# Patient Record
Sex: Male | Born: 1950 | Race: White | Hispanic: No | Marital: Married | Smoking: Current every day smoker
Health system: Southern US, Community
[De-identification: ages and names within clinical notes are randomized; demographics above are authoritative.]

## PROBLEM LIST (undated history)

## (undated) DIAGNOSIS — C4490 Unspecified malignant neoplasm of skin, unspecified: Secondary | ICD-10-CM

## (undated) DIAGNOSIS — H9190 Unspecified hearing loss, unspecified ear: Secondary | ICD-10-CM

## (undated) DIAGNOSIS — I10 Essential (primary) hypertension: Secondary | ICD-10-CM

## (undated) DIAGNOSIS — I499 Cardiac arrhythmia, unspecified: Secondary | ICD-10-CM

## (undated) DIAGNOSIS — M1990 Unspecified osteoarthritis, unspecified site: Secondary | ICD-10-CM

## (undated) DIAGNOSIS — K219 Gastro-esophageal reflux disease without esophagitis: Secondary | ICD-10-CM

## (undated) DIAGNOSIS — H8102 Meniere's disease, left ear: Secondary | ICD-10-CM

## (undated) DIAGNOSIS — I739 Peripheral vascular disease, unspecified: Secondary | ICD-10-CM

## (undated) DIAGNOSIS — I509 Heart failure, unspecified: Secondary | ICD-10-CM

## (undated) HISTORY — DX: Unspecified osteoarthritis, unspecified site: M19.90

## (undated) HISTORY — DX: Peripheral vascular disease, unspecified: I73.9

## (undated) HISTORY — PX: BACK SURGERY: SHX140

## (undated) HISTORY — PX: FOOT SURGERY: SHX648

---

## 2013-10-11 ENCOUNTER — Encounter: Payer: Self-pay | Admitting: Podiatry

## 2013-10-11 ENCOUNTER — Ambulatory Visit (INDEPENDENT_AMBULATORY_CARE_PROVIDER_SITE_OTHER): Payer: BC Managed Care – PPO | Admitting: Podiatry

## 2013-10-11 ENCOUNTER — Ambulatory Visit (INDEPENDENT_AMBULATORY_CARE_PROVIDER_SITE_OTHER): Payer: BC Managed Care – PPO

## 2013-10-11 DIAGNOSIS — M205X1 Other deformities of toe(s) (acquired), right foot: Secondary | ICD-10-CM

## 2013-10-11 DIAGNOSIS — M7989 Other specified soft tissue disorders: Principal | ICD-10-CM

## 2013-10-11 DIAGNOSIS — M79631 Pain in right forearm: Secondary | ICD-10-CM

## 2013-10-11 DIAGNOSIS — R52 Pain, unspecified: Secondary | ICD-10-CM

## 2013-10-11 NOTE — Patient Instructions (Signed)
Pre-Operative Instructions  Congratulations, you have decided to take an important step to improving your quality of life.  You can be assured that the doctors of [facility] will be with you every step of the way.  1. Plan to be at the surgery center/hospital at least 1 (one) hour prior to your scheduled time unless otherwise directed by the surgical center/hospital staff.  You must have a responsible adult accompany you, remain during the surgery and drive you home.  Make sure you have directions to the surgical center/hospital and know how to get there on time. 2. For hospital based surgery you will need to obtain a history and physical form from your family physician within 1 month prior to the date of surgery- we will give you a form for you primary physician.  3. We make every effort to accommodate the date you request for surgery.  There are however, times where surgery dates or times have to be moved.  We will contact you as soon as possible if a change in schedule is required.   4. No Aspirin/Ibuprofen for one week before surgery.  If you are on aspirin, any non-steroidal anti-inflammatory medications (Mobic, Aleve, Ibuprofen) you should stop taking it 7 days prior to your surgery.  You make take Tylenol  For pain prior to surgery.  5. Medications- If you are taking daily heart and blood pressure medications, seizure, reflux, allergy, asthma, anxiety, pain or diabetes medications, make sure the surgery center/hospital is aware before the day of surgery so they may notify you which medications to take or avoid the day of surgery. 6. No food or drink after midnight the night before surgery unless directed otherwise by surgical center/hospital staff. 7. No alcoholic beverages 24 hours prior to surgery.  No smoking 24 hours prior to or 24 hours after surgery. 8. Wear loose pants or shorts- loose enough to fit over bandages, boots, and casts. 9. No slip on shoes, sneakers are best. 10. Bring  your boot with you to the surgery center/hospital.  Also bring crutches or a walker if your physician has prescribed it for you.  If you do not have this equipment, it will be provided for you after surgery. 11. If you have not been contracted by the surgery center/hospital by the day before your surgery, call to confirm the date and time of your surgery. 12. Leave-time from work may vary depending on the type of surgery you have.  Appropriate arrangements should be made prior to surgery with your employer. 13. Prescriptions will be provided immediately following surgery by your doctor.  Have these filled as soon as possible after surgery and take the medication as directed. 14. Remove nail polish on the operative foot. 15. Wash the night before surgery.  The night before surgery wash the foot and leg well with the antibacterial soap provided and water paying special attention to beneath the toenails and in between the toes.  Rinse thoroughly with water and dry well with a towel.  Perform this wash unless told not to do so by your physician.  Enclosed: 1 Ice pack (please put in freezer the night before surgery)   1 Hibiclens skin cleaner   Pre-op Instructions  If you have any questions regarding the instructions, do not hesitate to call our office.  [place]: [address] [place], [place] [address] [phone]  [place]: [address] , [place] [address] [phone]  [place]: [address]  [place],  [address] [phone]  Dr. Richard   Tuchman DPM, Dr. Norman Regal DPM Dr. Richard Sikora DPM, Dr. M. Todd Hyatt DPM, Dr. Kathryn Egerton DPM 

## 2013-10-11 NOTE — Progress Notes (Signed)
   Subjective:    Patient ID: Daniel Pierce, male    DOB: 01/22/1950, 63 y.o.   MRN: [id number]  HPI  PT STATED RT FOOT ARCH, SWOLLEN  AND BALL OF THE SIDE OF THE FOOT IS SORE AND HAVE TIGHTNESS FEELING FOR 3 YEARS. THE FOOT IS GETTING WORSE NOT GETTING ANY BETTER. THE FOOT GET AGGRAVATED BY WALKING/PRESSURE. TRIED TO WEAR GOOD SHOES.   Review of Systems  Musculoskeletal: Positive for gait problem.  All other systems reviewed and are negative.      Objective:   Physical Exam: I have reviewed his past medical history medications allergies surgeries social history and review of systems. Pulses are strongly palpable bilateral. Neurologic sensorium is intact per since once the monofilament. Deep tendon reflexes are intact bilateral muscle strength is 5 over 5 dorsiflexors plantar flexors inverters everters all intrinsic musculature is intact. Orthopedic evaluation demonstrates limitation with regarding on range of motion of the first metatarsophalangeal joint of the right foot. He states he with range of motion of the first metatarsophalangeal joint he has pain that radiates proximally up his leg. Radiographic evaluation does demonstrate joint space narrowing subchondral sclerosis dorsal spurring and intra-articular ossicles. Cutaneous evaluation demonstrates supple well hydrated cutis no erythema tali this drainage or odor.        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  Plan: Discussed etiology pathology conservative versus surgical therapies. Due to the inability of him to be able to perform his daily activities surgery is indicated. We discussed as well as consented him to a Keller arthroplasty with single silicone implant right foot. I answered all the questions regarding this procedure to the best of my ability in layman's terms. He understands that is amenable to it and signed all 3 pages of the consent  form. We did discuss a possible postop complications which may include but are not limited to postop pain bleeding swelling allergic reaction to the implant the possibility of needing to fuse the joint. Also digit loss of limb loss of life. He was dispensed a Cam Walker for postop. And I will followup with him in one month for surgery.

## 2013-10-20 ENCOUNTER — Other Ambulatory Visit: Payer: Self-pay | Admitting: Podiatry

## 2013-10-20 MED ORDER — OXYCODONE-ACETAMINOPHEN 10-325 MG PO TABS: ORAL_TABLET | ORAL | Status: DC

## 2013-10-20 MED ORDER — CEPHALEXIN 500 MG PO CAPS: 500.0000 mg | ORAL_CAPSULE | Freq: Three times a day (TID) | ORAL | Status: DC

## 2013-10-20 MED ORDER — PROMETHAZINE HCL 25 MG PO TABS: 25.0000 mg | ORAL_TABLET | Freq: Three times a day (TID) | ORAL | Status: DC | PRN

## 2013-10-27 ENCOUNTER — Encounter: Payer: BC Managed Care – PPO | Admitting: Podiatry

## 2013-11-30 ENCOUNTER — Telehealth: Payer: Self-pay | Admitting: *Deleted

## 2013-11-30 NOTE — Telephone Encounter (Signed)
I called and informed the patient's wife that we have to have a letter sent to [place] by the doctor stating that he has been cleared for surgery.  "Okay, I'll call and let them know today.  Once you get that he can schedule surgery?"  I told her yes, we can schedule after we receive the note.

## 2013-11-30 NOTE — Telephone Encounter (Signed)
-----   Message from Roney Jaffe, RN sent at 11/30/2013  7:59 AM EST ----- This patient went to the cardiologist and he was cleared to have surgery, Dr Milinda Pointer patient. Can you please call to schedule a time [phone]

## 2013-12-08 ENCOUNTER — Telehealth: Payer: Self-pay | Admitting: *Deleted

## 2013-12-08 NOTE — Telephone Encounter (Signed)
Pt's wife asked if the medical clearance or note from the doctor okaying pt's surgery had arrived, and she would like to schedule surgery for the pt.

## 2013-12-09 NOTE — Telephone Encounter (Signed)
I returned her call.  "He wants to schedule surgery.  He wanted today because he's having a lot of pain with that foot.  I asked her if he signed consent forms already.  "He was scheduled before on 10/21/2013 but was not able to have it done.  Dr. Milinda Pointer said he needed to get medical clearance.  I guess he's going to have to wait now until January because he doesn't want to do it too close to Christmas.  What does he have available?"  I told her he can do it on 01/13/2014 or 01/20/2014.  She called her husband and stated, "He wants to do it on January 8th."  I told her I would get it scheduled.  I asked Kathryne Hitch for her surgical consent form and previous scheduling sheet.  She said she would locate it, it may have been sent off to be scanned.  Janett Billow gave me first and second pages of the consent form, the foot diagram was misplaced.

## 2014-01-10 ENCOUNTER — Telehealth: Payer: Self-pay | Admitting: *Deleted

## 2014-01-10 NOTE — Telephone Encounter (Signed)
Pt's wife states her husband is scheduled with Dr. Milinda Pointer for surgery on 01/13/2014, and she would like to know what medications he can take prior to the surgery.

## 2014-01-11 ENCOUNTER — Telehealth: Payer: Self-pay

## 2014-01-11 NOTE — Telephone Encounter (Signed)
Called and left a message for patient to call back with questions or concerns

## 2014-01-11 NOTE — Telephone Encounter (Signed)
Pt's wife reported to the office, asked if pt should continue the low-dose aspirin prescribed by his cardiologist, scheduled for surgery on Friday.   I told the pt's to have the pt continue the low-dose aspirin since prescribed by his cardiologist, but to be aware the pt may have a little more bleeding than someone not on the low-dose aspirin.

## 2014-01-13 DIAGNOSIS — M2011 Hallux valgus (acquired), right foot: Secondary | ICD-10-CM

## 2014-01-13 DIAGNOSIS — M205X1 Other deformities of toe(s) (acquired), right foot: Secondary | ICD-10-CM

## 2014-01-18 ENCOUNTER — Encounter: Payer: Self-pay | Admitting: Podiatry

## 2014-01-18 NOTE — Progress Notes (Unsigned)
DOS 01/13/2014 right keller bunion implant.

## 2014-01-19 ENCOUNTER — Ambulatory Visit (INDEPENDENT_AMBULATORY_CARE_PROVIDER_SITE_OTHER): Payer: BLUE CROSS/BLUE SHIELD | Admitting: Podiatry

## 2014-01-19 ENCOUNTER — Ambulatory Visit (INDEPENDENT_AMBULATORY_CARE_PROVIDER_SITE_OTHER): Payer: BLUE CROSS/BLUE SHIELD

## 2014-01-19 VITALS — BP 113/65 | HR 61 | Temp 98.1°F | Resp 16

## 2014-01-19 DIAGNOSIS — M21611 Bunion of right foot: Secondary | ICD-10-CM

## 2014-01-19 DIAGNOSIS — Z9889 Other specified postprocedural states: Secondary | ICD-10-CM

## 2014-01-19 DIAGNOSIS — M2011 Hallux valgus (acquired), right foot: Secondary | ICD-10-CM

## 2014-01-20 NOTE — Progress Notes (Signed)
He presents today 1 week status post Keller arthroplasty with a single silicone implant right foot. He states it is somewhat tender but doing all right. He denies fever chills nausea vomiting muscle aches and pains. States that he is tired of sitting.  Objective: He presents today dry sterile compressive and intact and wearing his Cam Walker. Vital signs are stable he is alert and oriented 3. Dry sterile dressing removed demonstrates mild edema no erythema cellulitis drainage or odor. Sutures are intact margins are well coapted with the exception of the distalmost aspect which demonstrates some very mild dehiscence. He has a great range of motion on active and passive manipulation. This is nontender. Radiographic evaluation demonstrates well-placed Keller arthroplasty single silicone implant right foot.  Assessment: Well-healing surgical foot right. Date of surgery 01/13/2014. Keller arthroplasty single silicone implant right foot.  Plan: [place] today with a dry sterile compressive dressing. Encouraged him to continue range of motion exercises and to continue the use of the Cam Walker. He is to keep this clean and dry and I will follow-up with him in 1 week at which time we will consider either a tennis shoe or a Darco shoe.

## 2014-01-26 ENCOUNTER — Ambulatory Visit (INDEPENDENT_AMBULATORY_CARE_PROVIDER_SITE_OTHER): Payer: BLUE CROSS/BLUE SHIELD

## 2014-01-26 ENCOUNTER — Ambulatory Visit (INDEPENDENT_AMBULATORY_CARE_PROVIDER_SITE_OTHER): Payer: BLUE CROSS/BLUE SHIELD | Admitting: Podiatry

## 2014-01-26 VITALS — BP 112/60 | HR 60 | Resp 16

## 2014-01-26 DIAGNOSIS — M2011 Hallux valgus (acquired), right foot: Secondary | ICD-10-CM

## 2014-01-26 DIAGNOSIS — M21611 Bunion of right foot: Secondary | ICD-10-CM

## 2014-01-26 DIAGNOSIS — Z9889 Other specified postprocedural states: Secondary | ICD-10-CM

## 2014-01-26 MED ORDER — CLINDAMYCIN HCL 150 MG PO CAPS: 150.0000 mg | ORAL_CAPSULE | Freq: Three times a day (TID) | ORAL | Status: DC

## 2014-01-26 NOTE — Progress Notes (Signed)
Daniel Pierce presents today he is now 3 weeks status post Daniel Pierce arthroplasty with a single silicone implant left foot. He denies fever chills nausea vomiting muscle aches and pains.  Objective: Vital signs are stable he is alert and oriented 3. Dry sterile dressing was removed today demonstrates well-healing surgical foot terrific range of motion of the first metatarsophalangeal joint. Radiographs confirmed same.  Assessment: Well-healing surgical foot status post Keller arthroplasty single silicone implant left foot.  Plan: I would allow him to start getting this wet and washing it. I encouraged him to continue to use the Darco shoe and a compression anklet. I will follow-up with him in 2 weeks at which time we'll try to get back into a regular pair of tennis shoes.

## 2014-02-09 ENCOUNTER — Ambulatory Visit (INDEPENDENT_AMBULATORY_CARE_PROVIDER_SITE_OTHER): Payer: BLUE CROSS/BLUE SHIELD

## 2014-02-09 ENCOUNTER — Other Ambulatory Visit: Payer: Self-pay | Admitting: Podiatry

## 2014-02-09 ENCOUNTER — Ambulatory Visit (INDEPENDENT_AMBULATORY_CARE_PROVIDER_SITE_OTHER): Payer: BLUE CROSS/BLUE SHIELD | Admitting: Podiatry

## 2014-02-09 DIAGNOSIS — Z9889 Other specified postprocedural states: Secondary | ICD-10-CM

## 2014-02-09 DIAGNOSIS — M2012 Hallux valgus (acquired), left foot: Secondary | ICD-10-CM

## 2014-02-09 DIAGNOSIS — M21612 Bunion of left foot: Secondary | ICD-10-CM

## 2014-02-09 MED ORDER — MUPIROCIN 2 % EX OINT: 1.0000 "application " | TOPICAL_OINTMENT | Freq: Two times a day (BID) | CUTANEOUS | Status: DC

## 2014-02-09 NOTE — Progress Notes (Signed)
He presents today approximately one-month status post Keller arthroplasty single silicone implant left foot. He denies fever chills nausea vomiting muscle aches and pains. Continues to keep the toe covered with a Band-Aid.  Objective: Vital signs are stable he is alert and oriented 3 as a full range of motion of the first metatarsophalangeal joint of the right foot. Superficial dehiscence of the distalmost aspect of the incision site with no erythema cellulitis drainage or odor fibrin deposition is noted. Debrided fibrin deposition today bleeding no signs of infection and does not probe deep.  Assessment: Well-healing surgical foot status post Keller arthroplasty single silicone implant with mild dehiscence distal wound.  Plan: Wrote prescription for Bactroban ointment and encouraged him to scrub the foot on a daily basis with a surgical scrub brush provided to him. He will then apply a small amount of the Bactroban and follow-up with me in 2 weeks.

## 2014-02-23 ENCOUNTER — Encounter: Payer: BLUE CROSS/BLUE SHIELD | Admitting: Podiatry

## 2015-02-26 DIAGNOSIS — Z9289 Personal history of other medical treatment: Secondary | ICD-10-CM | POA: Insufficient documentation

## 2015-09-04 ENCOUNTER — Ambulatory Visit (INDEPENDENT_AMBULATORY_CARE_PROVIDER_SITE_OTHER): Payer: BLUE CROSS/BLUE SHIELD | Admitting: Podiatry

## 2015-09-04 ENCOUNTER — Encounter: Payer: Self-pay | Admitting: Podiatry

## 2015-09-04 ENCOUNTER — Ambulatory Visit (INDEPENDENT_AMBULATORY_CARE_PROVIDER_SITE_OTHER): Payer: BLUE CROSS/BLUE SHIELD

## 2015-09-04 VITALS — BP 130/64 | HR 35 | Resp 16

## 2015-09-04 DIAGNOSIS — M778 Other enthesopathies, not elsewhere classified: Secondary | ICD-10-CM

## 2015-09-04 DIAGNOSIS — M779 Enthesopathy, unspecified: Secondary | ICD-10-CM

## 2015-09-04 DIAGNOSIS — M21611 Bunion of right foot: Secondary | ICD-10-CM | POA: Diagnosis not present

## 2015-09-04 DIAGNOSIS — R52 Pain, unspecified: Secondary | ICD-10-CM

## 2015-09-04 DIAGNOSIS — M7751 Other enthesopathy of right foot: Secondary | ICD-10-CM | POA: Diagnosis not present

## 2015-09-04 DIAGNOSIS — M722 Plantar fascial fibromatosis: Secondary | ICD-10-CM

## 2015-09-04 MED ORDER — METHYLPREDNISOLONE 4 MG PO TBPK: ORAL_TABLET | ORAL | 0 refills | Status: DC

## 2015-09-04 MED ORDER — MELOXICAM 15 MG PO TABS: 15.0000 mg | ORAL_TABLET | Freq: Every day | ORAL | 3 refills | Status: DC

## 2015-09-04 NOTE — Patient Instructions (Signed)

## 2015-09-05 NOTE — Progress Notes (Signed)
He presents today with a chief complaint of pain and pulling sensation to the dorsal aspect of his right foot as well as plantar medial aspect of the first metatarsophalangeal joint. He had a joint replacement done couple of years ago and states that he was doing fine until this past July. He relates some heel pain and more significant pain about the second metatarsal and second toe area. He denies trauma.  Objective: Vital signs are stable he is alert and oriented 3. I have reviewed his past medical history medications and allergies pulses are strongly palpable. He has great range of motion of all of the joints. Second metatarsophalangeal joint is tender on end range of motion. He has pain on palpation medial calcaneal tubercle. Radiographs taken today demonstrate Keller arthroplasty with single silicone implant intact without spurring he does have an elongated second metatarsal with medial deviation of the toe consistent with early dislocation syndrome he also has soft tissue increase in density of the plantar fascia calcaneal insertion site. No open lesions or wounds.  Assessment: Plantar fasciitis capsulitis second metatarsophalangeal joint right foot.  Plan: I injected the area today with Kenalog and local anesthetic to the heel and second metatarsophalangeal joint. Supplied him with a plantar fascia brace placed him on a Medrol Dosepak as well as meloxicam.

## 2015-09-25 ENCOUNTER — Ambulatory Visit: Payer: BLUE CROSS/BLUE SHIELD | Admitting: Podiatry

## 2016-01-06 ENCOUNTER — Other Ambulatory Visit: Payer: Self-pay | Admitting: Podiatry

## 2017-06-16 ENCOUNTER — Ambulatory Visit (INDEPENDENT_AMBULATORY_CARE_PROVIDER_SITE_OTHER): Payer: Medicare Other

## 2017-06-16 ENCOUNTER — Encounter: Payer: Self-pay | Admitting: Podiatry

## 2017-06-16 ENCOUNTER — Ambulatory Visit (INDEPENDENT_AMBULATORY_CARE_PROVIDER_SITE_OTHER): Payer: Medicare Other | Admitting: Podiatry

## 2017-06-16 DIAGNOSIS — I10 Essential (primary) hypertension: Secondary | ICD-10-CM | POA: Insufficient documentation

## 2017-06-16 DIAGNOSIS — M545 Low back pain, unspecified: Secondary | ICD-10-CM | POA: Insufficient documentation

## 2017-06-16 DIAGNOSIS — M2041 Other hammer toe(s) (acquired), right foot: Secondary | ICD-10-CM

## 2017-06-16 DIAGNOSIS — E782 Mixed hyperlipidemia: Secondary | ICD-10-CM | POA: Insufficient documentation

## 2017-06-16 DIAGNOSIS — M216X1 Other acquired deformities of right foot: Secondary | ICD-10-CM

## 2017-06-16 DIAGNOSIS — I428 Other cardiomyopathies: Secondary | ICD-10-CM | POA: Insufficient documentation

## 2017-06-16 DIAGNOSIS — I350 Nonrheumatic aortic (valve) stenosis: Secondary | ICD-10-CM | POA: Insufficient documentation

## 2017-06-16 DIAGNOSIS — Z720 Tobacco use: Secondary | ICD-10-CM | POA: Insufficient documentation

## 2017-06-16 DIAGNOSIS — I493 Ventricular premature depolarization: Secondary | ICD-10-CM | POA: Insufficient documentation

## 2017-06-16 DIAGNOSIS — I2510 Atherosclerotic heart disease of native coronary artery without angina pectoris: Secondary | ICD-10-CM | POA: Insufficient documentation

## 2017-06-16 DIAGNOSIS — I4729 Other ventricular tachycardia: Secondary | ICD-10-CM | POA: Insufficient documentation

## 2017-06-16 DIAGNOSIS — I472 Ventricular tachycardia: Secondary | ICD-10-CM | POA: Insufficient documentation

## 2017-06-16 NOTE — Progress Notes (Signed)
  Subjective:  Patient ID: Daniel Pierce, male    DOB: [birth year],  MRN: [id number] HPI Chief Complaint  Patient presents with  . Foot Pain    right foot ball of foot, dorsal, 2nd and 3rd toe pain; pt stated, "had surgery done about 3-4 yrs ago on right foot bunion thats starting to bother me now; 2nd and 3rd toes are rising with pulling pain over the top of my foot, also hurt under toes on bottom of foot; have tightness on top around to bottom middle of foot; been swelling and not much feeling in foot lately; been 2-5yrs dealing with this"    67 y.o. male presents with the above complaint.   ROS: Denies fever chills nausea vomiting muscle aches and pains.  Past Medical History:  Diagnosis Date  . Arthritis    Past Surgical History:  Procedure Laterality Date  . BACK SURGERY      Current Outpatient Medications:  .  atorvastatin (LIPITOR) 20 MG tablet, , Disp: , Rfl:  .  cyclobenzaprine (FLEXERIL) 5 MG tablet, Take 5 mg by mouth as needed. , Disp: , Rfl:  .  lisinopril-hydrochlorothiazide (PRINZIDE,ZESTORETIC) 20-12.5 MG tablet, Take by mouth., Disp: , Rfl:  .  meloxicam (MOBIC) 15 MG tablet, Take 1 tablet (15 mg total) by mouth daily., Disp: 30 tablet, Rfl: 3 .  methylPREDNISolone (MEDROL) 4 MG TBPK tablet, Tapering 6 day dose pack, Disp: 21 tablet, Rfl: 0 .  metoprolol tartrate (LOPRESSOR) 25 MG tablet, Take by mouth., Disp: , Rfl:  .  traMADol (ULTRAM) 50 MG tablet, Take by mouth every 6 (six) hours as needed., Disp: , Rfl:   No Known Allergies Review of Systems Objective:  There were no vitals filed for this visit.  General: Well developed, nourished, in no acute distress, alert and oriented x3   Dermatological: Skin is warm, dry and supple bilateral. Nails x 10 are well maintained; remaining integument appears unremarkable at this time. There are no open sores, no preulcerative lesions, no rash or signs of infection present.  Vascular: Dorsalis Pedis artery and Posterior  Tibial artery pedal pulses are 2/4 bilateral with immedate capillary fill time. Pedal hair growth present. No varicosities and no lower extremity edema present bilateral.   Neruologic: Grossly intact via light touch bilateral. Vibratory intact via tuning fork bilateral. Protective threshold with Semmes Wienstein monofilament intact to all pedal sites bilateral. Patellar and Achilles deep tendon reflexes 2+ bilateral. No Babinski or clonus noted bilateral.   Musculoskeletal: No gross boney pedal deformities bilateral. No pain, crepitus, or limitation noted with foot and ankle range of motion bilateral. Muscular strength 5/5 in all groups tested bilateral.  Pain on palpation lesser metatarsal phalangeal joints with severe hammertoe deformities.  Toes are rigid and not able to straighten up completely.  There dorsiflexed over the head of the metatarsals.  Gait: Unassisted, Nonantalgic.    Radiographs:  Demonstrates Keller arthroplasty with a single silicone implant that is still in good position and good condition.  Demonstrate severe hammertoe deformities 2 through 5 of the right foot.  Assessment & Plan:   Assessment: Hammertoe deformity capsulitis metatarsalgia 2 through 5 of the right foot.    Plan: He was scanned for set of orthotics today.     Max T. [place], [place]

## 2017-06-19 ENCOUNTER — Telehealth: Payer: Self-pay | Admitting: Podiatry

## 2017-06-19 NOTE — Telephone Encounter (Signed)
Spoke to pts wife about the orthotics that were ordered and that they are not covered by medicare. The cost is 398.00 and to let me know if pt wants to proceed. She is to call on Monday.   I did not see a consent form signed in chart.

## 2017-06-22 NOTE — Telephone Encounter (Signed)
pts wife called back and states husband wants to go ahead and proceed with orthotics and is aware of the 398.00 charges

## 2017-06-23 NOTE — Telephone Encounter (Signed)
Can you watch to see if these get put on the correct doctor please?

## 2017-07-14 ENCOUNTER — Ambulatory Visit: Payer: Medicare Other | Admitting: Orthotics

## 2017-07-14 DIAGNOSIS — M21611 Bunion of right foot: Secondary | ICD-10-CM

## 2017-07-14 DIAGNOSIS — M216X1 Other acquired deformities of right foot: Secondary | ICD-10-CM

## 2017-07-14 NOTE — Progress Notes (Signed)
Patient came in today to pick up custom made foot orthotics.  The goals were accomplished and the patient reported no dissatisfaction with said orthotics.  Patient was advised of breakin period and how to report any issues. 

## 2017-07-21 NOTE — Telephone Encounter (Signed)
Orthotics were filed under correct doctor Metallurgist)

## 2017-08-25 ENCOUNTER — Ambulatory Visit (INDEPENDENT_AMBULATORY_CARE_PROVIDER_SITE_OTHER): Payer: Medicare Other | Admitting: Podiatry

## 2017-08-25 ENCOUNTER — Encounter: Payer: Self-pay | Admitting: Podiatry

## 2017-08-25 DIAGNOSIS — M7750 Other enthesopathy of unspecified foot: Secondary | ICD-10-CM | POA: Diagnosis not present

## 2017-08-25 DIAGNOSIS — M779 Enthesopathy, unspecified: Principal | ICD-10-CM

## 2017-08-25 DIAGNOSIS — M778 Other enthesopathies, not elsewhere classified: Secondary | ICD-10-CM

## 2017-08-25 DIAGNOSIS — T148XXA Other injury of unspecified body region, initial encounter: Secondary | ICD-10-CM

## 2017-08-25 MED ORDER — CYCLOBENZAPRINE HCL 5 MG PO TABS: 5.0000 mg | ORAL_TABLET | Freq: Three times a day (TID) | ORAL | 2 refills | Status: DC | PRN

## 2017-08-25 MED ORDER — MELOXICAM 15 MG PO TABS: 15.0000 mg | ORAL_TABLET | Freq: Every day | ORAL | 3 refills | Status: DC

## 2017-08-25 NOTE — Progress Notes (Signed)
He presents today for follow-up of his capsulitis of the second metatarsal phalangeal joint and hammertoe deformity #2 #3 the right foot.  He states that I got his orthotics and they seem to be doing okay from my feet my feet feel a whole lot better however my right hip and knee and back are crazy painful when I first get up in the mornings but once a day going up doing pretty well.  Objective: Vital signs are stable he is alert and oriented x3.  There is no erythema edema sialitis drainage or odor he has tenderness on palpation of the second metatarsal phalangeal joint and third metatarsal phalangeal joint with rigid hammertoe deformities.  Assessment: Orthotics seem to be doing well for the capsulitis which appears to be resolving.  Plan: Wrote a prescription for Flexeril and meloxicam.  This should help with his calf thigh and back.  I will follow-up with him as needed.  I expressed to him that he would take several months before all this works out.

## 2017-11-03 ENCOUNTER — Ambulatory Visit: Payer: Medicare Other | Admitting: Podiatry

## 2018-01-27 ENCOUNTER — Other Ambulatory Visit: Payer: Self-pay | Admitting: Podiatry

## 2018-01-27 DIAGNOSIS — M778 Other enthesopathies, not elsewhere classified: Secondary | ICD-10-CM

## 2018-01-27 DIAGNOSIS — M779 Enthesopathy, unspecified: Principal | ICD-10-CM

## 2018-12-01 ENCOUNTER — Other Ambulatory Visit: Payer: Self-pay

## 2018-12-01 ENCOUNTER — Ambulatory Visit (INDEPENDENT_AMBULATORY_CARE_PROVIDER_SITE_OTHER): Payer: Medicare Other | Admitting: Otolaryngology

## 2018-12-01 ENCOUNTER — Encounter (INDEPENDENT_AMBULATORY_CARE_PROVIDER_SITE_OTHER): Payer: Self-pay | Admitting: Otolaryngology

## 2018-12-01 VITALS — Temp 97.0°F

## 2018-12-01 DIAGNOSIS — H8102 Meniere's disease, left ear: Secondary | ICD-10-CM

## 2018-12-01 DIAGNOSIS — J310 Chronic rhinitis: Secondary | ICD-10-CM | POA: Diagnosis not present

## 2018-12-01 MED ORDER — MECLIZINE HCL 25 MG PO TABS: 25.0000 mg | ORAL_TABLET | Freq: Three times a day (TID) | ORAL | 0 refills | Status: DC | PRN

## 2018-12-01 MED ORDER — TRIAMTERENE-HCTZ 37.5-25 MG PO CAPS: 1.0000 | ORAL_CAPSULE | Freq: Every day | ORAL | 6 refills | Status: DC

## 2018-12-01 NOTE — Addendum Note (Signed)
Addended by: Melony Overly E on: 12/01/2018 12:46 PM   Modules accepted: Orders

## 2018-12-01 NOTE — Progress Notes (Signed)
HPI: Daniel Pierce is a 68 y.o. male who presents for evaluation of left ear complaints.  He apparently has fluctuating hearing loss in the left ear.  Presently he has significant decreased hearing in the ear.  Sometimes it gets better.  He thought it might be related to allergies.  He has been treated for eustachian tube dysfunction and otitis media with minimal success.  He has previously seen ENT in [place].  He also has dizziness and sometimes feels poorly when his left ear blocks up.  He he was recently told that it may be Mnire's disease.  He presents here for another opinion. Denies any ear pain.  No drainage from the ear.  Denies any trauma to the ear.  His right ear essentially is normal.  Past Medical History:  Diagnosis Date  . Arthritis    Past Surgical History:  Procedure Laterality Date  . BACK SURGERY     Social History   Socioeconomic History  . Marital status: Married    Spouse name: Not on file  . Number of children: Not on file  . Years of education: Not on file  . Highest education level: Not on file  Occupational History  . Not on file  Social Needs  . Financial resource strain: Not on file  . Food insecurity    Worry: Not on file    Inability: Not on file  . Transportation needs    Medical: Not on file    Non-medical: Not on file  Tobacco Use  . Smoking status: Current Every Day Smoker    Packs/day: 1.00    Years: 20.00    Pack years: 20.00    Types: Cigarettes  . Smokeless tobacco: Never Used  Substance and Sexual Activity  . Alcohol use: Not on file  . Drug use: Not on file  . Sexual activity: Not on file  Lifestyle  . Physical activity    Days per week: Not on file    Minutes per session: Not on file  . Stress: Not on file  Relationships  . Social Herbalist on phone: Not on file    Gets together: Not on file    Attends religious service: Not on file    Active member of club or organization: Not on file    Attends meetings of  clubs or organizations: Not on file    Relationship status: Not on file  Other Topics Concern  . Not on file  Social History Narrative  . Not on file   No family history on file. No Known Allergies Prior to Admission medications   Medication Sig Start Date End Date Taking? Authorizing Provider  atorvastatin (LIPITOR) 20 MG tablet  08/14/15   [provider]  cyclobenzaprine (FLEXERIL) 5 MG tablet Take 5 mg by mouth as needed.  06/22/15   [provider]  cyclobenzaprine (FLEXERIL) 5 MG tablet Take 1 tablet (5 mg total) by mouth 3 (three) times daily as needed for muscle spasms. 08/25/17   Hyatt, Max T, DPM  lisinopril (PRINIVIL,ZESTRIL) 20 MG tablet Take 20 mg by mouth daily. 07/28/17   [provider]  lisinopril-hydrochlorothiazide (PRINZIDE,ZESTORETIC) 20-12.5 MG tablet Take by mouth. 02/14/15   [provider]  meloxicam (MOBIC) 15 MG tablet Take 1 tablet (15 mg total) by mouth daily. 09/04/15   Hyatt, Max T, DPM  meloxicam (MOBIC) 15 MG tablet TAKE 1 TABLET BY MOUTH EVERY DAY 01/27/18   Hyatt, Max T, DPM  metoprolol tartrate (LOPRESSOR) 25 MG tablet Take by mouth. 02/14/15   [provider]  montelukast (SINGULAIR) 10 MG tablet every evening. 07/20/17   [provider]  traMADol (ULTRAM) 50 MG tablet Take by mouth every 6 (six) hours as needed.    [provider]     Positive ROS: He has had episodes of dizziness and vertigo in the past that may last for several hours..  All other systems have been reviewed and were otherwise negative with the exception of those mentioned in the HPI and as above.  Physical Exam: Constitutional: Alert, well-appearing, no acute distress Ears: External ears without lesions or tenderness. Ear canals are clear bilaterally with intact, clear TMs.  TMs have normal mobility on pneumatic otoscopy bilaterally.  On tuning fork testing he has marked decreased hearing in the left ear compared to the right ear  with a 512 tuning fork.  Nasal: External nose without lesions. Septum mild septal deformity.  No signs of infection..  Moderate rhinitis with clear mucus discharge. Oral: Lips and gums without lesions. Tongue and palate mucosa without lesions. Posterior oropharynx clear. Neck: No palpable adenopathy or masses Respiratory: Breathing comfortably  Skin: No facial/neck lesions or rash noted.  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 Also prescribed meclizine 25 mg every 8 hours as needed dizziness or vertigo. Instructed him and his wife on a low-salt diet We will schedule audiogram and ENG testing and have him follow-up following the audiogram and ENG testing.  Radene Journey, MD

## 2018-12-22 ENCOUNTER — Other Ambulatory Visit (INDEPENDENT_AMBULATORY_CARE_PROVIDER_SITE_OTHER): Payer: Self-pay

## 2018-12-22 ENCOUNTER — Ambulatory Visit (INDEPENDENT_AMBULATORY_CARE_PROVIDER_SITE_OTHER): Payer: Medicare Other | Admitting: Otolaryngology

## 2018-12-22 ENCOUNTER — Encounter (INDEPENDENT_AMBULATORY_CARE_PROVIDER_SITE_OTHER): Payer: Self-pay | Admitting: Otolaryngology

## 2018-12-22 ENCOUNTER — Other Ambulatory Visit: Payer: Self-pay

## 2018-12-22 VITALS — Temp 95.4°F

## 2018-12-22 DIAGNOSIS — H8102 Meniere's disease, left ear: Secondary | ICD-10-CM | POA: Diagnosis not present

## 2018-12-22 DIAGNOSIS — H9042 Sensorineural hearing loss, unilateral, left ear, with unrestricted hearing on the contralateral side: Secondary | ICD-10-CM

## 2018-12-22 NOTE — Progress Notes (Signed)
HPI: Daniel Pierce is a 68 y.o. male who returns today for evaluation of dizziness and hearing loss..  This initially began about 5 years ago.  He has had several spells of vertigo as well as fluctuating left ear hearing loss.  He has been treated for allergies by an ENT in [place].  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 He recently underwent VNG testing as well as audiologic testing 2 weeks ago.  This demonstrated left unilateral weakness of 32% consistent with left peripheral pathology.  He also had left ear moderate severe SNHL especially in the lower frequencies.  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 He has been taking Dyazide in the evenings regularly over the past 2 weeks..  Past Medical History:  Diagnosis Date  . Arthritis    Past Surgical History:  Procedure Laterality Date  . BACK SURGERY     Social History   Socioeconomic History  . Marital status: Married    Spouse name: Not on file  . Number of children: Not on file  . Years of education: Not on file  . Highest education level: Not on file  Occupational History  . Not on file  Tobacco Use  . Smoking status: Current Every Day Smoker    Packs/day: 1.00    Years: 50.00    Pack years: 50.00    Types: Cigarettes    Start date: 53  . Smokeless tobacco: Never Used  Substance and Sexual Activity  . Alcohol use: Not on file  . Drug use: Not on file  . Sexual activity: Not on file  Other Topics Concern  . Not on file  Social History Narrative  . Not on file   Social Determinants of Health   Financial Resource Strain:   . Difficulty of Paying Living Expenses: Not on file  Food Insecurity:   . Worried About Charity fundraiser in the Last  Year: Not on file  . Ran Out of Food in the Last Year: Not on file  Transportation Needs:   . Lack of Transportation (Medical): Not on file  . Lack of Transportation (Non-Medical): Not on file  Physical Activity:   . Days of Exercise per Week: Not on file  . Minutes of Exercise per Session: Not on file  Stress:   . Feeling of Stress : Not on file  Social Connections:   . Frequency of Communication with Friends and Family: Not on file  . Frequency of Social Gatherings with Friends and Family: Not on file  . Attends Religious Services: Not on file  . Active Member of Clubs or Organizations: Not on file  . Attends Archivist Meetings: Not on file  . Marital Status: Not on file   No family history on file. No Known Allergies Prior to Admission medications   Medication Sig Start Date End Date Taking? Authorizing Provider  atorvastatin (LIPITOR) 20 MG tablet  08/14/15  Yes [provider]  cyclobenzaprine (FLEXERIL) 5 MG tablet Take 5 mg by mouth as needed.  06/22/15  Yes [provider]  cyclobenzaprine (FLEXERIL) 5 MG tablet Take 1 tablet (5 mg total) by mouth 3 (three) times daily as needed for muscle spasms. 08/25/17  Yes Hyatt, Max  T, DPM  lisinopril (PRINIVIL,ZESTRIL) 20 MG tablet Take 20 mg by mouth daily. 07/28/17  Yes [provider]  lisinopril-hydrochlorothiazide (PRINZIDE,ZESTORETIC) 20-12.5 MG tablet Take by mouth. 02/14/15  Yes [provider]  meclizine (ANTIVERT) 25 MG tablet Take 1 tablet (25 mg total) by mouth 3 (three) times daily as needed for dizziness. 12/01/18  Yes Rozetta Nunnery, MD  meloxicam (MOBIC) 15 MG tablet Take 1 tablet (15 mg total) by mouth daily. 09/04/15  Yes Hyatt, Max T, DPM  meloxicam (MOBIC) 15 MG tablet TAKE 1 TABLET BY MOUTH EVERY DAY 01/27/18  Yes Hyatt, Max T, DPM  metoprolol tartrate (LOPRESSOR) 25 MG tablet Take by mouth. 02/14/15  Yes [provider]  montelukast (SINGULAIR) 10 MG tablet every  evening. 07/20/17  Yes [provider]  traMADol (ULTRAM) 50 MG tablet Take by mouth every 6 (six) hours as needed.   Yes [provider]  triamterene-hydrochlorothiazide (DYAZIDE) 37.5-25 MG capsule Take 1 each (1 capsule total) by mouth daily. 12/01/18  Yes Rozetta Nunnery, MD     Positive ROS: Presently no headaches.  All other systems have been reviewed and were otherwise negative with the exception of those mentioned in the HPI and as above.  Physical Exam: Constitutional: Alert, well-appearing, no acute distress Ears: External ears without lesions or tenderness. Ear canals are clear bilaterally with intact, clear TMs.  Nasal: External nose without lesions. Septum with mild deformity.. Clear nasal passages.  No evidence of infection Oral: Lips and gums without lesions. Tongue and palate mucosa without lesions. Posterior oropharynx clear. Neck: No palpable adenopathy or masses Respiratory: Breathing comfortably  Skin: No facial/neck lesions or rash noted.  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 He will continue with Dyazide 1 tablet daily a.m. Also recommended reducing the salt in his diet is much as possible. He will follow-up in 1 month to recheck.   Radene Journey, MD

## 2018-12-23 ENCOUNTER — Other Ambulatory Visit (INDEPENDENT_AMBULATORY_CARE_PROVIDER_SITE_OTHER): Payer: Self-pay

## 2018-12-23 DIAGNOSIS — H9042 Sensorineural hearing loss, unilateral, left ear, with unrestricted hearing on the contralateral side: Secondary | ICD-10-CM

## 2018-12-28 ENCOUNTER — Ambulatory Visit (INDEPENDENT_AMBULATORY_CARE_PROVIDER_SITE_OTHER): Payer: Medicare Other | Admitting: Otolaryngology

## 2019-01-03 ENCOUNTER — Encounter (INDEPENDENT_AMBULATORY_CARE_PROVIDER_SITE_OTHER): Payer: Self-pay

## 2019-01-19 ENCOUNTER — Ambulatory Visit (INDEPENDENT_AMBULATORY_CARE_PROVIDER_SITE_OTHER): Payer: Medicare Other | Admitting: Otolaryngology

## 2019-01-25 ENCOUNTER — Ambulatory Visit
Admission: RE | Admit: 2019-01-25 | Discharge: 2019-01-25 | Disposition: A | Discharge status: Home / Self Care | Payer: Medicare Other | Source: Ambulatory Visit | Attending: Otolaryngology | Admitting: Otolaryngology

## 2019-01-25 ENCOUNTER — Other Ambulatory Visit: Payer: Self-pay

## 2019-01-25 DIAGNOSIS — H9042 Sensorineural hearing loss, unilateral, left ear, with unrestricted hearing on the contralateral side: Secondary | ICD-10-CM

## 2019-01-25 MED ORDER — GADOBENATE DIMEGLUMINE 529 MG/ML IV SOLN
9.0000 mL | Freq: Once | INTRAVENOUS | Status: AC | PRN
  Administered 2019-01-25: 9 mL via INTRAVENOUS

## 2019-02-02 ENCOUNTER — Encounter (INDEPENDENT_AMBULATORY_CARE_PROVIDER_SITE_OTHER): Payer: Self-pay | Admitting: Otolaryngology

## 2019-02-02 ENCOUNTER — Ambulatory Visit (INDEPENDENT_AMBULATORY_CARE_PROVIDER_SITE_OTHER): Payer: Medicare Other | Admitting: Otolaryngology

## 2019-02-02 ENCOUNTER — Other Ambulatory Visit: Payer: Self-pay

## 2019-02-02 VITALS — Temp 96.3°F

## 2019-02-02 DIAGNOSIS — H8102 Meniere's disease, left ear: Secondary | ICD-10-CM | POA: Diagnosis not present

## 2019-02-02 NOTE — Progress Notes (Signed)
HPI: Daniel Pierce is a 69 y.o. male who returns today for evaluation of left ear fluctuating hearing loss.  He had an audiogram performed in November that showed a moderate severe left low-frequency sensorineural hearing loss.  He also underwent VNG that showed findings consistent with left ear peripheral pathology.  He has been diagnosed with Mnire's disease and was just recently started on diuretics and a low-salt diet and has been doing better overall although he still has fluctuating hearing in the left ear.  He is hearing better today.Marland Kitchen MRI scan performed last week demonstrated no cochlear or retrocochlear pathology.  Past Medical History:  Diagnosis Date  . Arthritis    Past Surgical History:  Procedure Laterality Date  . BACK SURGERY     Social History   Socioeconomic History  . Marital status: Married    Spouse name: Not on file  . Number of children: Not on file  . Years of education: Not on file  . Highest education level: Not on file  Occupational History  . Not on file  Tobacco Use  . Smoking status: Current Every Day Smoker    Packs/day: 1.00    Years: 50.00    Pack years: 50.00    Types: Cigarettes    Start date: 4  . Smokeless tobacco: Never Used  Substance and Sexual Activity  . Alcohol use: Not on file  . Drug use: Not on file  . Sexual activity: Not on file  Other Topics Concern  . Not on file  Social History Narrative  . Not on file   Social Determinants of Health   Financial Resource Strain:   . Difficulty of Paying Living Expenses: Not on file  Food Insecurity:   . Worried About Charity fundraiser in the Last Year: Not on file  . Ran Out of Food in the Last Year: Not on file  Transportation Needs:   . Lack of Transportation (Medical): Not on file  . Lack of Transportation (Non-Medical): Not on file  Physical Activity:   . Days of Exercise per Week: Not on file  . Minutes of Exercise per Session: Not on file  Stress:   . Feeling of  Stress : Not on file  Social Connections:   . Frequency of Communication with Friends and Family: Not on file  . Frequency of Social Gatherings with Friends and Family: Not on file  . Attends Religious Services: Not on file  . Active Member of Clubs or Organizations: Not on file  . Attends Archivist Meetings: Not on file  . Marital Status: Not on file   No family history on file. No Known Allergies Prior to Admission medications   Medication Sig Start Date End Date Taking? Authorizing Provider  atorvastatin (LIPITOR) 20 MG tablet  08/14/15  Yes [provider]  cyclobenzaprine (FLEXERIL) 5 MG tablet Take 5 mg by mouth as needed.  06/22/15  Yes [provider]  cyclobenzaprine (FLEXERIL) 5 MG tablet Take 1 tablet (5 mg total) by mouth 3 (three) times daily as needed for muscle spasms. 08/25/17  Yes Hyatt, Max T, DPM  lisinopril (PRINIVIL,ZESTRIL) 20 MG tablet Take 20 mg by mouth daily. 07/28/17  Yes [provider]  lisinopril-hydrochlorothiazide (PRINZIDE,ZESTORETIC) 20-12.5 MG tablet Take by mouth. 02/14/15  Yes [provider]  meclizine (ANTIVERT) 25 MG tablet Take 1 tablet (25 mg total) by mouth 3 (three) times daily as needed for dizziness. 12/01/18  Yes Rozetta Nunnery, MD  meloxicam (MOBIC) 15 MG tablet Take 1 tablet (15 mg total) by mouth daily. 09/04/15  Yes Hyatt, Max T, DPM  meloxicam (MOBIC) 15 MG tablet TAKE 1 TABLET BY MOUTH EVERY DAY 01/27/18  Yes Hyatt, Max T, DPM  metoprolol tartrate (LOPRESSOR) 25 MG tablet Take by mouth. 02/14/15  Yes [provider]  montelukast (SINGULAIR) 10 MG tablet every evening. 07/20/17  Yes [provider]  traMADol (ULTRAM) 50 MG tablet Take by mouth every 6 (six) hours as needed.   Yes [provider]  triamterene-hydrochlorothiazide (DYAZIDE) 37.5-25 MG capsule Take 1 each (1 capsule total) by mouth daily. 12/01/18  Yes Rozetta Nunnery, MD     Positive ROS: Otherwise  negative  All other systems have been reviewed and were otherwise negative with the exception of those mentioned in the HPI and as above.  Physical Exam: Constitutional: Alert, well-appearing, no acute distress Ears: External ears without lesions or tenderness. Ear canals are clear bilaterally with intact, clear TMs.  On tuning fork testing he has only mild hearing loss in the left ear with a 512 tuning fork. Nasal: External nose without lesions. Septum minimal deformity.. Clear nasal passages.  No signs of infection Oral: Lips and gums without lesions. Tongue and palate mucosa without lesions. Posterior oropharynx clear. Neck: No palpable adenopathy or masses Respiratory: Breathing comfortably  Skin: No facial/neck lesions or rash noted.  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  Plan: Reviewed with the patient as well as his wife concerning treatment of Mnire's disease which would be to continue with diuretic in the morning and low-salt diet. If he has an attack of vertigo he has used Dramamine in the past.  I gave him a prescription for Valium 5 mg tablet 1 every 8 hours if he has any further vertigo attacks. He will follow-up in 6 months for recheck and repeat audiologic testing.   Radene Journey, MD

## 2019-05-02 ENCOUNTER — Other Ambulatory Visit (INDEPENDENT_AMBULATORY_CARE_PROVIDER_SITE_OTHER): Payer: Self-pay

## 2019-05-27 ENCOUNTER — Encounter (INDEPENDENT_AMBULATORY_CARE_PROVIDER_SITE_OTHER): Payer: Self-pay

## 2019-05-27 NOTE — Progress Notes (Unsigned)
Faxed in Rx refill request to [facility] in [place]. For Triamterene-HCTZ 37.5-25mg  cp. #30 w/6 refills. Take 1 capsule my mouth QD.

## 2019-05-31 ENCOUNTER — Encounter (INDEPENDENT_AMBULATORY_CARE_PROVIDER_SITE_OTHER): Payer: Self-pay

## 2019-05-31 NOTE — Progress Notes (Unsigned)
Called Rx in [place] On 05/02/2019. Diazepam 5mg  Take Q8H PRN for vertigo. #40.

## 2019-07-27 ENCOUNTER — Ambulatory Visit (INDEPENDENT_AMBULATORY_CARE_PROVIDER_SITE_OTHER): Payer: Medicare Other | Admitting: Otolaryngology

## 2019-07-27 ENCOUNTER — Other Ambulatory Visit: Payer: Self-pay

## 2019-07-27 VITALS — Temp 97.2°F

## 2019-07-27 DIAGNOSIS — H8102 Meniere's disease, left ear: Secondary | ICD-10-CM

## 2019-07-27 DIAGNOSIS — H9113 Presbycusis, bilateral: Secondary | ICD-10-CM | POA: Diagnosis not present

## 2019-07-27 NOTE — Progress Notes (Signed)
HPI: Daniel Pierce is a 69 y.o. male who returns today for evaluation of Mnire's disease.  He has been taking a diuretic.  He is not having any significant vertigo attacks however his hearing still fluctuates somewhat.  He returns today for audiologic testing.  On audiologic testing his hearing test today looked slightly better than the previous test in November of last year with SRT improving from 60 dB to 45 dB with slight improvement of the lower frequency.  He had type A tympanograms bilaterally.. Apparently has history of hearing loss in his family.  His father had a cochlear implant.  His sister also has hearing aids. 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  Past Medical History:  Diagnosis Date  . Arthritis    Past Surgical History:  Procedure Laterality Date  . BACK SURGERY     Social History   Socioeconomic History  . Marital status: Married    Spouse name: Not on file  . Number of children: Not on file  . Years of education: Not on file  . Highest education level: Not on file  Occupational History  . Not on file  Tobacco Use  . Smoking status: Current Every Day Smoker    Packs/day: 1.00    Years: 50.00    Pack years: 50.00    Types: Cigarettes    Start date: 44  . Smokeless tobacco: Never Used  Substance and Sexual Activity  . Alcohol use: Not on file  . Drug use: Not on file  . Sexual activity: Not on file  Other Topics Concern  . Not on file  Social History Narrative  . Not on file   Social Determinants of Health   Financial Resource Strain:   . Difficulty of Paying Living Expenses:   Food Insecurity:   . Worried About Charity fundraiser in the Last Year:   . Arboriculturist in the Last Year:   Transportation Needs:   . Film/video editor (Medical):   Marland Kitchen Lack of Transportation  (Non-Medical):   Physical Activity:   . Days of Exercise per Week:   . Minutes of Exercise per Session:   Stress:   . Feeling of Stress :   Social Connections:   . Frequency of Communication with Friends and Family:   . Frequency of Social Gatherings with Friends and Family:   . Attends Religious Services:   . Active Member of Clubs or Organizations:   . Attends Archivist Meetings:   Marland Kitchen Marital Status:    No family history on file. No Known Allergies Prior to Admission medications   Medication Sig Start Date End Date Taking? Authorizing Provider  atorvastatin (LIPITOR) 20 MG tablet  08/14/15  Yes [provider]  cyclobenzaprine (FLEXERIL) 5 MG tablet Take 5 mg by mouth as needed.  06/22/15  Yes [provider]  cyclobenzaprine (FLEXERIL) 5 MG tablet Take 1 tablet (5 mg total) by mouth 3 (three) times daily as needed for muscle spasms. 08/25/17  Yes Hyatt, Max T, DPM  lisinopril (PRINIVIL,ZESTRIL) 20 MG tablet Take 20 mg by mouth daily. 07/28/17  Yes [provider]  lisinopril-hydrochlorothiazide (PRINZIDE,ZESTORETIC) 20-12.5 MG tablet Take by mouth. 02/14/15  Yes [provider]  meclizine (ANTIVERT) 25 MG tablet Take 1 tablet (25 mg total) by mouth 3 (three)  times daily as needed for dizziness. 12/01/18  Yes Rozetta Nunnery, MD  meloxicam (MOBIC) 15 MG tablet Take 1 tablet (15 mg total) by mouth daily. 09/04/15  Yes Hyatt, Max T, DPM  meloxicam (MOBIC) 15 MG tablet TAKE 1 TABLET BY MOUTH EVERY DAY 01/27/18  Yes Hyatt, Max T, DPM  metoprolol tartrate (LOPRESSOR) 25 MG tablet Take by mouth. 02/14/15  Yes [provider]  montelukast (SINGULAIR) 10 MG tablet every evening. 07/20/17  Yes [provider]  traMADol (ULTRAM) 50 MG tablet Take by mouth every 6 (six) hours as needed.   Yes [provider]  triamterene-hydrochlorothiazide (DYAZIDE) 37.5-25 MG capsule Take 1 each (1 capsule total) by mouth daily. 12/01/18  Yes  Rozetta Nunnery, MD     Positive ROS: Otherwise negative  All other systems have been reviewed and were otherwise negative with the exception of those mentioned in the HPI and as above.  Physical Exam: Constitutional: Alert, well-appearing, no acute distress Ears: External ears without lesions or tenderness. Ear canals are clear bilaterally with intact, clear TMs bilaterally. Nasal: External nose without lesions Clear nasal passages Oral: Lips and gums without lesions. Tongue and palate mucosa without lesions. Posterior oropharynx clear. Neck: No palpable adenopathy or masses Respiratory: Breathing comfortably  Skin: No facial/neck lesions or rash noted.  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  Plan: He would be a candidate for hearing aids and discussed this with Shanon Brow as well as his wife. Concerning the Mnire's disease would recommend continuation with the diuretic and use low-salt diet.  Concerning his fluctuating hearing I suspect this is related to Mnire's disease.  As TMs are otherwise clear. Also gave him some samples of Lipo flavonoid to try as h he complains of tinnitus.   Radene Journey, MD

## 2019-07-29 ENCOUNTER — Encounter (INDEPENDENT_AMBULATORY_CARE_PROVIDER_SITE_OTHER): Payer: Self-pay

## 2019-10-18 ENCOUNTER — Other Ambulatory Visit (INDEPENDENT_AMBULATORY_CARE_PROVIDER_SITE_OTHER): Payer: Self-pay | Admitting: Otolaryngology

## 2020-02-22 ENCOUNTER — Ambulatory Visit (INDEPENDENT_AMBULATORY_CARE_PROVIDER_SITE_OTHER): Payer: Medicare Other | Admitting: Otolaryngology

## 2020-03-07 ENCOUNTER — Other Ambulatory Visit: Payer: Self-pay

## 2020-03-07 ENCOUNTER — Other Ambulatory Visit (INDEPENDENT_AMBULATORY_CARE_PROVIDER_SITE_OTHER): Payer: Self-pay

## 2020-03-07 ENCOUNTER — Encounter (INDEPENDENT_AMBULATORY_CARE_PROVIDER_SITE_OTHER): Payer: Self-pay | Admitting: Otolaryngology

## 2020-03-07 ENCOUNTER — Ambulatory Visit (INDEPENDENT_AMBULATORY_CARE_PROVIDER_SITE_OTHER): Payer: Medicare Other | Admitting: Otolaryngology

## 2020-03-07 VITALS — Temp 96.6°F

## 2020-03-07 DIAGNOSIS — K118 Other diseases of salivary glands: Secondary | ICD-10-CM | POA: Diagnosis not present

## 2020-03-07 NOTE — Progress Notes (Signed)
HPI: Daniel Pierce is a 69 y.o. male who returns today for evaluation of mass below the left earlobe that he has noted for the past several months.  He states that it sometimes fluctuates in size and sometimes seems to cause balance problems when it is bigger and may last for couple of hours.  He had recently been diagnosed with Mnire's disease a little over a year ago.  At that time he had some fluctuating hearing loss in the left ear and had an MRI scan of his head.  On review of this previous MRI scan he appeared to have a left parotid mass at that time.  Past Medical History:  Diagnosis Date  . Arthritis    Past Surgical History:  Procedure Laterality Date  . BACK SURGERY     Social History   Socioeconomic History  . Marital status: Married    Spouse name: Not on file  . Number of children: Not on file  . Years of education: Not on file  . Highest education level: Not on file  Occupational History  . Not on file  Tobacco Use  . Smoking status: Current Every Day Smoker    Packs/day: 1.00    Years: 50.00    Pack years: 50.00    Types: Cigarettes    Start date: 25  . Smokeless tobacco: Never Used  Substance and Sexual Activity  . Alcohol use: Not on file  . Drug use: Not on file  . Sexual activity: Not on file  Other Topics Concern  . Not on file  Social History Narrative  . Not on file   Social Determinants of Health   Financial Resource Strain: Not on file  Food Insecurity: Not on file  Transportation Needs: Not on file  Physical Activity: Not on file  Stress: Not on file  Social Connections: Not on file   No family history on file. No Known Allergies Prior to Admission medications   Medication Sig Start Date End Date Taking? Authorizing Provider  atorvastatin (LIPITOR) 20 MG tablet  08/14/15   [provider]  cyclobenzaprine (FLEXERIL) 5 MG tablet Take 5 mg by mouth as needed.  06/22/15   [provider]  cyclobenzaprine (FLEXERIL) 5 MG  tablet Take 1 tablet (5 mg total) by mouth 3 (three) times daily as needed for muscle spasms. 08/25/17   Hyatt, Max T, DPM  lisinopril (PRINIVIL,ZESTRIL) 20 MG tablet Take 20 mg by mouth daily. 07/28/17   [provider]  lisinopril-hydrochlorothiazide (PRINZIDE,ZESTORETIC) 20-12.5 MG tablet Take by mouth. 02/14/15   [provider]  meclizine (ANTIVERT) 25 MG tablet Take 1 tablet (25 mg total) by mouth 3 (three) times daily as needed for dizziness. 12/01/18   Rozetta Nunnery, MD  meloxicam (MOBIC) 15 MG tablet Take 1 tablet (15 mg total) by mouth daily. 09/04/15   Hyatt, Max T, DPM  meloxicam (MOBIC) 15 MG tablet TAKE 1 TABLET BY MOUTH EVERY DAY 01/27/18   Hyatt, Max T, DPM  metoprolol tartrate (LOPRESSOR) 25 MG tablet Take by mouth. 02/14/15   [provider]  montelukast (SINGULAIR) 10 MG tablet every evening. 07/20/17   [provider]  traMADol (ULTRAM) 50 MG tablet Take by mouth every 6 (six) hours as needed.    [provider]  triamterene-hydrochlorothiazide (DYAZIDE) 37.5-25 MG capsule TAKE 1 EACH (1 CAPSULE TOTAL) BY MOUTH DAILY. 10/19/19   Rozetta Nunnery, MD     Positive ROS: Otherwise negative  All other systems  have been reviewed and were otherwise negative with the exception of those mentioned in the HPI and as above.  Physical Exam: Constitutional: Alert, well-appearing, no acute distress Ears: External ears without lesions or tenderness. Ear canals are clear bilaterally with intact, clear TMs bilaterally. Nasal: External nose without lesions.. Clear nasal passages Oral: Lips and gums without lesions. Tongue and palate mucosa without lesions. Posterior oropharynx clear. Neck: No palpable adenopathy or masses.  Patient has what appears to be on palpation and approximate 2-2 and half centimeter mass within the tail of the left parotid gland just below the earlobe.  No palpable adenopathy lower in the neck.  The mass is slightly firm  but not tender to palpation.  He has normal facial nerve function. Respiratory: Breathing comfortably  Skin: No facial/neck lesions or rash noted.  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  Plan: We will plan on ultrasound-guided fine-needle aspirate of left parotid mass.  He will call us concerning the results of the fine-needle aspirate.   Radene Journey, MD

## 2020-03-16 ENCOUNTER — Other Ambulatory Visit (INDEPENDENT_AMBULATORY_CARE_PROVIDER_SITE_OTHER): Payer: Self-pay

## 2020-03-16 ENCOUNTER — Telehealth (INDEPENDENT_AMBULATORY_CARE_PROVIDER_SITE_OTHER): Payer: Self-pay

## 2020-03-16 DIAGNOSIS — K118 Other diseases of salivary glands: Secondary | ICD-10-CM

## 2020-03-16 NOTE — Telephone Encounter (Signed)
Daniel Pierce wife called in because he had not been called yet to schedule [place] FNA of left parotid glad. I called radiology and they informed me that pt needed and U/S of head and neck done before they will schedule U/S FNA of parotid. This was ok by Dr. Lucia Gaskins and order put in. Wife was called back and notified.

## 2020-04-09 ENCOUNTER — Ambulatory Visit
Admission: RE | Admit: 2020-04-09 | Discharge: 2020-04-09 | Disposition: A | Discharge status: Home / Self Care | Payer: Medicare Other | Source: Ambulatory Visit | Attending: Otolaryngology | Admitting: Otolaryngology

## 2020-04-09 DIAGNOSIS — K118 Other diseases of salivary glands: Secondary | ICD-10-CM

## 2020-04-10 ENCOUNTER — Encounter (HOSPITAL_COMMUNITY): Payer: Self-pay

## 2020-04-10 NOTE — Progress Notes (Unsigned)
Daniel Pierce Male, 70 y.o., 19-Jun-1950  MRN:  [id number] Phone:  [phone] (H)       PCP:  Pcp, No Primary Cvg:  Medicare/Medicare Part A And B  Next Appt With Radiology ([facility]-US 2) 04/17/2020 at 1:00 PM           RE: Biopsy Received: Today  Message Details  Corrie Mckusick, DO  [name]; P Ir Procedure Requests OK for US guided parotid mass biopsy.   Earleen Newport    Previous Messages  ----- Message -----  From: Lenore Cordia  Sent: 04/10/2020  9:16 AM EDT  To: Ir Procedure Requests  Subject: Biopsy                      Procedure Requested: [place] FNA    Reason for Procedure:  left parotid mass    Provider Requesting: Dr Melony Overly  Provider Telephone: [phone]    Other Info:

## 2020-04-16 ENCOUNTER — Other Ambulatory Visit: Payer: Self-pay | Admitting: Radiology

## 2020-04-17 ENCOUNTER — Other Ambulatory Visit: Payer: Self-pay

## 2020-04-17 ENCOUNTER — Encounter (HOSPITAL_COMMUNITY): Payer: Self-pay

## 2020-04-17 ENCOUNTER — Ambulatory Visit (HOSPITAL_COMMUNITY)
Admission: RE | Admit: 2020-04-17 | Discharge: 2020-04-17 | Disposition: A | Discharge status: Home / Self Care | Payer: Medicare Other | Source: Ambulatory Visit | Attending: Otolaryngology | Admitting: Otolaryngology

## 2020-04-17 DIAGNOSIS — Z79899 Other long term (current) drug therapy: Secondary | ICD-10-CM | POA: Diagnosis not present

## 2020-04-17 DIAGNOSIS — D110 Benign neoplasm of parotid gland: Secondary | ICD-10-CM | POA: Diagnosis not present

## 2020-04-17 DIAGNOSIS — K118 Other diseases of salivary glands: Secondary | ICD-10-CM

## 2020-04-17 DIAGNOSIS — F17210 Nicotine dependence, cigarettes, uncomplicated: Secondary | ICD-10-CM | POA: Insufficient documentation

## 2020-04-17 DIAGNOSIS — R221 Localized swelling, mass and lump, neck: Secondary | ICD-10-CM | POA: Diagnosis present

## 2020-04-17 MED ORDER — SODIUM CHLORIDE 0.9 % IV SOLN: INTRAVENOUS | Status: DC

## 2020-04-17 MED ORDER — FENTANYL CITRATE (PF) 100 MCG/2ML IJ SOLN
INTRAMUSCULAR | Status: AC
  Filled 2020-04-17: qty 2

## 2020-04-17 MED ORDER — LIDOCAINE-EPINEPHRINE 1 %-1:100000 IJ SOLN
INTRAMUSCULAR | Status: AC
  Filled 2020-04-17: qty 1

## 2020-04-17 MED ORDER — MIDAZOLAM HCL 2 MG/2ML IJ SOLN
INTRAMUSCULAR | Status: AC
  Filled 2020-04-17: qty 2

## 2020-04-17 NOTE — Procedures (Signed)
Pre Procedure Dx: Indeterminate left parotid nodule Post Procedural Dx: Same  Technically successful US guided biopsy of indeterminate nodule within the left parotid gland.  EBL: None  No immediate complications.   Ronny Bacon, MD Pager #: [phone]

## 2020-04-17 NOTE — H&P (Signed)
Chief Complaint: Patient was seen in consultation today for left parotid mass biopsy at the request of Newman,Christopher E  Referring Physician(s): Newman,Christopher E  Supervising Physician: Arne Cleveland  Patient Status: [facility] - Out-pt  History of Present Illness: Daniel Pierce is a 70 y.o. male   Pt has noticed left parotid mass approx 6 months No pain; some hearing loss Gets larger and smaller dependent on activity or angle of head  has seen Dr Lucia Gaskins-- 03/07/20 MRI performed 02/25/19 does not mention this finding-- but Dr Lucia Gaskins feels it was there on imaging even then per note.  + smoker  Known bradycardia-- pt is followed by Cardiology in [place] Last visit 11/2019    Past Medical History:  Diagnosis Date  . Arthritis     Past Surgical History:  Procedure Laterality Date  . BACK SURGERY      Allergies: Patient has no known allergies.  Medications: Prior to Admission medications   Medication Sig Start Date End Date Taking? Authorizing Provider  metoprolol tartrate (LOPRESSOR) 25 MG tablet Take 25 mg by mouth daily. 02/14/15  Yes [provider]  triamterene-hydrochlorothiazide (DYAZIDE) 37.5-25 MG capsule TAKE 1 EACH (1 CAPSULE TOTAL) BY MOUTH DAILY. 10/19/19  Yes Rozetta Nunnery, MD  Vitamins-Lipotropics (LIPOFLAVONOID) TABS Take 2-3 tablets by mouth daily.   Yes [provider]     History reviewed. No pertinent family history.  Social History   Socioeconomic History  . Marital status: Married    Spouse name: Not on file  . Number of children: Not on file  . Years of education: Not on file  . Highest education level: Not on file  Occupational History  . Not on file  Tobacco Use  . Smoking status: Current Every Day Smoker    Packs/day: 1.00    Years: 50.00    Pack years: 50.00    Types: Cigarettes    Start date: 36  . Smokeless tobacco: Never Used  Substance and Sexual Activity  .  Alcohol use: Not on file  . Drug use: Not on file  . Sexual activity: Not on file  Other Topics Concern  . Not on file  Social History Narrative  . Not on file   Social Determinants of Health   Financial Resource Strain: Not on file  Food Insecurity: Not on file  Transportation Needs: Not on file  Physical Activity: Not on file  Stress: Not on file  Social Connections: Not on file    Review of Systems: A 12 point ROS discussed and pertinent positives are indicated in the HPI above.  All other systems are negative.  Review of Systems  Constitutional: Negative for activity change, fatigue and fever.  HENT: Negative for tinnitus and trouble swallowing.   Respiratory: Negative for cough and shortness of breath.   Cardiovascular: Negative for chest pain.  Gastrointestinal: Negative for abdominal pain and nausea.  Psychiatric/Behavioral: Negative for behavioral problems and confusion.    Vital Signs: BP (!) 146/95   Pulse (!) 34   Temp 97.8 F (36.6 C) (Oral)   Ht 6\' 2"  (1.88 m)   Wt 187 lb (84.8 kg)   SpO2 100%   BMI 24.01 kg/m   Physical Exam Vitals reviewed.  HENT:     Mouth/Throat:     Mouth: Mucous membranes are moist.  Neck:     Comments: Palpable left parotid mass NT Cardiovascular:     Rate and Rhythm: Regular rhythm. Bradycardia present.  Heart sounds: Normal heart sounds.  Pulmonary:     Effort: Pulmonary effort is normal.     Breath sounds: Normal breath sounds.  Abdominal:     Palpations: Abdomen is soft.  Musculoskeletal:        General: Normal range of motion.  Skin:    General: Skin is warm.  Neurological:     General: No focal deficit present.     Mental Status: He is alert.  Psychiatric:        Behavior: Behavior normal.     Imaging: US Soft Tissue Head/Neck (NON-THYROID)  Result Date: 04/09/2020 CLINICAL DATA:  Left parotid mass EXAM: ULTRASOUND OF HEAD/NECK SOFT TISSUES TECHNIQUE: Ultrasound examination of the head and neck soft  tissues was performed in the area of clinical concern. COMPARISON:  None. FINDINGS: Sonographic interrogation of the region of clinical concern demonstrates a circumscribed hypoechoic soft tissue mass within the left parotid gland measuring 3.8 x 2.0 x 2.4 cm. The mass appears to have a microcystic appearance with multiple small linear echogenic reflectors representing the posterior sidewall of internal cystic spaces. Color Doppler imaging demonstrates internal vascularity. IMPRESSION: The palpable abnormality corresponds with a solid/spongiform soft tissue mass measuring 3.8 x 2.0 x 2.4 cm. Differential considerations include both benign and malignant neoplasms. Recommend further evaluation with percutaneous sampling. Electronically Signed   By: Jacqulynn Cadet M.D.   On: 04/09/2020 09:59    Labs:  CBC: No results for input(s): WBC, HGB, HCT, PLT in the last 8760 hours.  COAGS: No results for input(s): INR, APTT in the last 8760 hours.  BMP: No results for input(s): NA, K, CL, CO2, GLUCOSE, BUN, CALCIUM, CREATININE, GFRNONAA, GFRAA in the last 8760 hours.  Invalid input(s): CMP  LIVER FUNCTION TESTS: No results for input(s): BILITOT, AST, ALT, ALKPHOS, PROT, ALBUMIN in the last 8760 hours.  TUMOR MARKERS: No results for input(s): AFPTM, CEA, CA199, CHROMGRNA in the last 8760 hours.  Assessment and Plan:  Left parotid mass Known at least 6 months Denies pain; but can change in size periodically Scheduled for biopsy per Dr Lucia Gaskins ENT Risks and benefits of left parotid mass biopsy was discussed with the patient and/or patient's family including, but not limited to bleeding, infection, damage to adjacent structures or low yield requiring additional tests.  All of the questions were answered and there is agreement to proceed. Consent signed and in chart.   Thank you for this interesting consult.  I greatly enjoyed meeting Daniel Pierce and look forward to participating in their  care.  A copy of this report was sent to the requesting provider on this date.  Electronically Signed: Lavonia Drafts, PA-C 04/17/2020, 11:26 AM   I spent a total of  30 Minutes   in face to face in clinical consultation, greater than 50% of which was counseling/coordinating care for left parotid mass bx

## 2020-04-17 NOTE — Discharge Instructions (Addendum)

## 2020-04-17 NOTE — Progress Notes (Signed)
D/C home ambulatory with wife. Awake and alert. In no distress. Bandaid to left neck, dry and intact.

## 2020-04-18 LAB — SURGICAL PATHOLOGY

## 2020-04-25 ENCOUNTER — Telehealth (INDEPENDENT_AMBULATORY_CARE_PROVIDER_SITE_OTHER): Payer: Self-pay | Admitting: Otolaryngology

## 2020-04-25 NOTE — Telephone Encounter (Signed)
Discussed with patient concerning results of the needle biopsy of the left parotid gland mass.  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  I discussed with the patient that this is a benign tumor and does not need to be removed unless it is giving him problems.  He feels like it is giving him problems and would like to have this removed.  Discussed with him that this will require general anesthesia and overnight stay. He is followed by cardiologist Dr. Sondra Come who works for [employer].  We will need to get cardiac clearance prior to scheduling surgery.  This will need to be scheduled at [facility].

## 2020-04-27 ENCOUNTER — Telehealth (INDEPENDENT_AMBULATORY_CARE_PROVIDER_SITE_OTHER): Payer: Self-pay

## 2020-04-27 NOTE — Telephone Encounter (Signed)
I got fax # for Dr. Ottie Glazier to sent a Cardiac Medical Clearance form  for Armel Rabbani to have a 3 hour parotidectomy under general anesthesia. Fax # [phone],  Phone # [phone].

## 2020-06-08 NOTE — Progress Notes (Addendum)
Surgical Instructions    Your procedure is scheduled on 06/18/20.  Report to [facility] Main Entrance "A" at 7:30 A.M., then check in with the Admitting office.  Call this number if you have problems the morning of surgery:  [phone]   If you have any questions prior to your surgery date call [phone]: Open Monday-Friday 8am-4pm    Remember:  Do not eat or drink after midnight the night before your surgery    Take these medicines the morning of surgery with A SIP OF WATER: NONE   As of today, STOP taking any Aspirin (unless otherwise instructed by your surgeon) Aleve, Naproxen, Ibuprofen, Motrin, Advil, Goody's, BC's, all herbal medications, fish oil, and all vitamins.          Do not wear jewelry  Do not wear lotions, powders, colognes, or deodorant. Do not shave 48 hours prior to surgery.  Men may shave face and neck. Do not bring valuables to the hospital.              [facility] is not responsible for any belongings or valuables.  Do NOT Smoke (Tobacco/Vaping) or drink Alcohol 24 hours prior to your procedure If you use a CPAP at night, you may bring all equipment for your overnight stay.   Contacts, glasses, dentures or bridgework may not be worn into surgery, please bring cases for these belongings   For patients admitted to the hospital, discharge time will be determined by your treatment team.   Patients discharged the day of surgery will not be allowed to drive home, and someone needs to stay with them for 24 hours.    Special instructions:    Oral Hygiene is also important to reduce your risk of infection.  Remember - BRUSH YOUR TEETH THE MORNING OF SURGERY WITH YOUR REGULAR TOOTHPASTE   [place]- Preparing For Surgery  Before surgery, you can play an important role. Because skin is not sterile, your skin needs to be as free of germs as possible. You can reduce the number of germs on your skin by washing with CHG (chlorahexidine gluconate) Soap  before surgery.  CHG is an antiseptic cleaner which kills germs and bonds with the skin to continue killing germs even after washing.     Please do not use if you have an allergy to CHG or antibacterial soaps. If your skin becomes reddened/irritated stop using the CHG.  Do not shave (including legs and underarms) for at least 48 hours prior to first CHG shower. It is OK to shave your face.  Please follow these instructions carefully.    1.  Shower the NIGHT BEFORE SURGERY and the MORNING OF SURGERY with CHG Soap.   If you chose to wash your hair, wash your hair first as usual with your normal shampoo. After you shampoo, rinse your hair and body thoroughly to remove the shampoo.  Then [employer] and genitals (private parts) with your normal soap and rinse thoroughly to remove soap.  2. After that Use CHG Soap as you would any other liquid soap. You can apply CHG directly to the skin and wash gently with a scrungie or a clean washcloth.   3. Apply the CHG Soap to your body ONLY FROM THE NECK DOWN.  Do not use on open wounds or open sores. Avoid contact with your eyes, ears, mouth and genitals (private parts). Wash Face and genitals (private parts)  with your normal soap.   4. Wash thoroughly, paying special attention  to the area where your surgery will be performed.  5. Thoroughly rinse your body with warm water from the neck down.  6. DO NOT shower/wash with your normal soap after using and rinsing off the CHG Soap.  7. Pat yourself dry with a CLEAN TOWEL.  8. Wear CLEAN PAJAMAS to bed the night before surgery  9. Place CLEAN SHEETS on your bed the night before your surgery  10. DO NOT SLEEP WITH PETS.   Day of Surgery:  Take a shower with CHG soap. Wear Clean/Comfortable clothing the morning of surgery Do not apply any deodorants/lotions.   Remember to brush your teeth WITH YOUR REGULAR TOOTHPASTE.   Please read over the following fact sheets that you were given.

## 2020-06-11 ENCOUNTER — Other Ambulatory Visit: Payer: Self-pay

## 2020-06-11 ENCOUNTER — Encounter (HOSPITAL_COMMUNITY): Payer: Self-pay

## 2020-06-11 ENCOUNTER — Encounter (HOSPITAL_COMMUNITY)
Admission: RE | Admit: 2020-06-11 | Discharge: 2020-06-11 | Disposition: A | Discharge status: Home / Self Care | Payer: Medicare Other | Source: Ambulatory Visit | Attending: Otolaryngology | Admitting: Otolaryngology

## 2020-06-11 DIAGNOSIS — I2510 Atherosclerotic heart disease of native coronary artery without angina pectoris: Secondary | ICD-10-CM | POA: Insufficient documentation

## 2020-06-11 DIAGNOSIS — I272 Pulmonary hypertension, unspecified: Secondary | ICD-10-CM | POA: Diagnosis not present

## 2020-06-11 DIAGNOSIS — I10 Essential (primary) hypertension: Secondary | ICD-10-CM | POA: Insufficient documentation

## 2020-06-11 DIAGNOSIS — I083 Combined rheumatic disorders of mitral, aortic and tricuspid valves: Secondary | ICD-10-CM | POA: Diagnosis not present

## 2020-06-11 DIAGNOSIS — Z20822 Contact with and (suspected) exposure to covid-19: Secondary | ICD-10-CM | POA: Insufficient documentation

## 2020-06-11 DIAGNOSIS — Z01818 Encounter for other preprocedural examination: Secondary | ICD-10-CM | POA: Insufficient documentation

## 2020-06-11 DIAGNOSIS — I428 Other cardiomyopathies: Secondary | ICD-10-CM | POA: Diagnosis not present

## 2020-06-11 DIAGNOSIS — E785 Hyperlipidemia, unspecified: Secondary | ICD-10-CM | POA: Insufficient documentation

## 2020-06-11 DIAGNOSIS — I493 Ventricular premature depolarization: Secondary | ICD-10-CM | POA: Diagnosis not present

## 2020-06-11 HISTORY — DX: Cardiac arrhythmia, unspecified: I49.9

## 2020-06-11 HISTORY — DX: Gastro-esophageal reflux disease without esophagitis: K21.9

## 2020-06-11 LAB — CBC
HCT: 47.5 % (ref 39.0–52.0)
Hemoglobin: 15.7 g/dL (ref 13.0–17.0)
MCH: 32.2 pg (ref 26.0–34.0)
MCHC: 33.1 g/dL (ref 30.0–36.0)
MCV: 97.5 fL (ref 80.0–100.0)
Platelets: 215 10*3/uL (ref 150–400)
RBC: 4.87 MIL/uL (ref 4.22–5.81)
RDW: 12.9 % (ref 11.5–15.5)
WBC: 8.9 10*3/uL (ref 4.0–10.5)
nRBC: 0 % (ref 0.0–0.2)

## 2020-06-11 LAB — BASIC METABOLIC PANEL
Anion gap: 8 (ref 5–15)
BUN: 32 mg/dL — ABNORMAL HIGH (ref 8–23)
CO2: 26 mmol/L (ref 22–32)
Calcium: 9.7 mg/dL (ref 8.9–10.3)
Chloride: 106 mmol/L (ref 98–111)
Creatinine, Ser: 1.84 mg/dL — ABNORMAL HIGH (ref 0.61–1.24)
GFR, Estimated: 39 mL/min — ABNORMAL LOW (ref >60)
Glucose, Bld: 91 mg/dL (ref 70–99)
Potassium: 3.9 mmol/L (ref 3.5–5.1)
Sodium: 140 mmol/L (ref 135–145)

## 2020-06-11 NOTE — Progress Notes (Signed)
PCP: Denies Cardiologist: Donn Pierini, MD  EKG: 06/11/20 CXR: na ECHO: 02/26/15. Patient states has had an echo more recently.  Requested copy from Dr. Sondra Come Stress Test: Requested copy from Dr.  Sondra Come Cardiac Cath: 11/29/13.  Requested copy from Dr. Sondra Come  Fasting Blood Sugar- na Checks Blood Sugar__na_ times a day  ASA/Blood Thinners: No  OSA/CPAP: No  Covid test 06/11/20 at PAT  Anesthesia Review: Yes, per note 02/26/15 in [facility] patient has hx of non-obstructive CAD and dysrhythmia.  Requested info from cardiology.  Patient denies shortness of breath, fever, cough, and chest pain at PAT appointment.  Patient verbalized understanding of instructions provided today at the PAT appointment.  Patient asked to review instructions at home and day of surgery.

## 2020-06-11 NOTE — Progress Notes (Addendum)
Patient stated at PAT that he takes his Metoprolol at HS and diuretic in am.

## 2020-06-12 ENCOUNTER — Ambulatory Visit (INDEPENDENT_AMBULATORY_CARE_PROVIDER_SITE_OTHER): Payer: Self-pay | Admitting: Otolaryngology

## 2020-06-12 DIAGNOSIS — D110 Benign neoplasm of parotid gland: Secondary | ICD-10-CM

## 2020-06-12 LAB — SARS CORONAVIRUS 2 (TAT 6-24 HRS): SARS Coronavirus 2: NEGATIVE

## 2020-06-12 NOTE — H&P (View-Only) (Signed)
PREOPERATIVE H&P  Chief Complaint: Left parotid tumor  HPI: Daniel Pierce is a 70 y.o. male who presents for evaluation of left parotid mass that he has had for years but is gradually gotten little bit larger.  It measures approximately 3 cm in size and just below the left earlobe.  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  He has normal facial nerve function.  I discussed with him that this is a benign tumor and he could elect to have it removed or not.  He would like to have it removed as he feels like it swells up at times and causes problems.  He is taken to the operating room this time for left parotidectomy and facial nerve dissection. He has received cardiac clearance from his cardiologist and is at low risk.  Past Medical History:  Diagnosis Date  . Arthritis   . Dysrhythmia    PVC's  . GERD (gastroesophageal reflux disease)    Past Surgical History:  Procedure Laterality Date  . BACK SURGERY    . FOOT SURGERY Right    Social History   Socioeconomic History  . Marital status: Married    Spouse name: Not on file  . Number of children: Not on file  . Years of education: Not on file  . Highest education level: Not on file  Occupational History  . Not on file  Tobacco Use  . Smoking status: Current Every Day Smoker    Packs/day: 1.00    Years: 50.00    Pack years: 50.00    Types: Cigarettes    Start date: 14  . Smokeless tobacco: Never Used  Vaping Use  . Vaping Use: Never used  Substance and Sexual Activity  . Alcohol use: Not Currently  . Drug use: Never  . Sexual activity: Not on file  Other Topics Concern  . Not on file  Social History Narrative  . Not on file   Social Determinants of Health   Financial Resource Strain: Not on file  Food Insecurity: Not on file  Transportation Needs: Not on file  Physical Activity: Not on file  Stress: Not on file  Social Connections: Not on file   No  family history on file. No Known Allergies Prior to Admission medications   Medication Sig Start Date End Date Taking? Authorizing Provider  metoprolol tartrate (LOPRESSOR) 25 MG tablet Take 25 mg by mouth daily. 02/14/15   [provider]  triamterene-hydrochlorothiazide (DYAZIDE) 37.5-25 MG capsule TAKE 1 EACH (1 CAPSULE TOTAL) BY MOUTH DAILY. 10/19/19   Rozetta Nunnery, MD  Vitamins-Lipotropics (LIPOFLAVONOID) TABS Take 2-3 tablets by mouth daily.    [provider]     Positive ROS: Otherwise negative  All other systems have been reviewed and were otherwise negative with the exception of those mentioned in the HPI and as above.  Physical Exam: There were no vitals filed for this visit.  General: Alert, no acute distress Oral: Normal oral mucosa and tonsils Nasal: Clear nasal passages Neck: No palpable adenopathy or thyroid nodules.  Patient has a 3 cm mass below the left earlobe in the region of the tail of the parotid.  He has normal facial nerve function.  No adenopathy lower in the neck. Ear: Ear canal is clear with normal appearing TMs Cardiovascular: Regular rate and rhythm, no murmur.  Respiratory: Clear to auscultation Neurologic: Alert and oriented x 3   Assessment/Plan: Left parotid tumor  Plan for left  parotidectomy with facial nerve dissection.   Melony Overly, MD 06/12/2020 2:33 PM

## 2020-06-12 NOTE — H&P (Signed)
PREOPERATIVE H&P  Chief Complaint: Left parotid tumor  HPI: Daniel Pierce is a 70 y.o. male who presents for evaluation of left parotid mass that he has had for years but is gradually gotten little bit larger.  It measures approximately 3 cm in size and just below the left earlobe.  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  He has normal facial nerve function.  I discussed with him that this is a benign tumor and he could elect to have it removed or not.  He would like to have it removed as he feels like it swells up at times and causes problems.  He is taken to the operating room this time for left parotidectomy and facial nerve dissection. He has received cardiac clearance from his cardiologist and is at low risk.  Past Medical History:  Diagnosis Date  . Arthritis   . Dysrhythmia    PVC's  . GERD (gastroesophageal reflux disease)    Past Surgical History:  Procedure Laterality Date  . BACK SURGERY    . FOOT SURGERY Right    Social History   Socioeconomic History  . Marital status: Married    Spouse name: Not on file  . Number of children: Not on file  . Years of education: Not on file  . Highest education level: Not on file  Occupational History  . Not on file  Tobacco Use  . Smoking status: Current Every Day Smoker    Packs/day: 1.00    Years: 50.00    Pack years: 50.00    Types: Cigarettes    Start date: 74  . Smokeless tobacco: Never Used  Vaping Use  . Vaping Use: Never used  Substance and Sexual Activity  . Alcohol use: Not Currently  . Drug use: Never  . Sexual activity: Not on file  Other Topics Concern  . Not on file  Social History Narrative  . Not on file   Social Determinants of Health   Financial Resource Strain: Not on file  Food Insecurity: Not on file  Transportation Needs: Not on file  Physical Activity: Not on file  Stress: Not on file  Social Connections: Not on file   No  family history on file. No Known Allergies Prior to Admission medications   Medication Sig Start Date End Date Taking? Authorizing Provider  metoprolol tartrate (LOPRESSOR) 25 MG tablet Take 25 mg by mouth daily. 02/14/15   [provider]  triamterene-hydrochlorothiazide (DYAZIDE) 37.5-25 MG capsule TAKE 1 EACH (1 CAPSULE TOTAL) BY MOUTH DAILY. 10/19/19   Rozetta Nunnery, MD  Vitamins-Lipotropics (LIPOFLAVONOID) TABS Take 2-3 tablets by mouth daily.    [provider]     Positive ROS: Otherwise negative  All other systems have been reviewed and were otherwise negative with the exception of those mentioned in the HPI and as above.  Physical Exam: There were no vitals filed for this visit.  General: Alert, no acute distress Oral: Normal oral mucosa and tonsils Nasal: Clear nasal passages Neck: No palpable adenopathy or thyroid nodules.  Patient has a 3 cm mass below the left earlobe in the region of the tail of the parotid.  He has normal facial nerve function.  No adenopathy lower in the neck. Ear: Ear canal is clear with normal appearing TMs Cardiovascular: Regular rate and rhythm, no murmur.  Respiratory: Clear to auscultation Neurologic: Alert and oriented x 3   Assessment/Plan: Left parotid tumor  Plan for left  parotidectomy with facial nerve dissection.   Melony Overly, MD 06/12/2020 2:33 PM

## 2020-06-13 NOTE — Progress Notes (Signed)
Anesthesia Chart Review:  Follows with cardiologist Dr. Sondra Come for history of HTN, resolved nonischemic cardiomyopathy, mild aortic stenosis and aortic insufficiency, mild nonobstructive coronary disease, frequent PVCs, hyperlipidemia.  Per notes, history of mild nonobstructive coronary disease seen on cardiac catheterization performed in November 2015 with previously mildly reduced LV function (ejection fraction 45% before but 60% on TTE 12/2014). Last seen 10/05/2019.  Per note, he has good exercise tolerance with no symptoms of angina.  Most recent echocardiogram was performed in December 2020 which revealed normal left ventricular size, wall thickness, wall motion, and function, EF 55 to 60%, mild grade 1 diastolic dysfunction, normal RV size and function, mild pulmonary hypertension with an estimated systolic PA pressure of 64-15 mmHg, and mild AS, AI, MR, TR, and PI.  Recommend he continue aggressive medical therapy follow-up in 1 year.  Cardiac clearance dated 05/07/2020 states, "This is to inform you that the above-mentioned patient is followed by me and has been cleared for his upcoming surgery.  The patient had an echocardiogram performed in December 2020 which showed normal left ventricular function and no significant valvular disease.  This patient had a cardiac catheterization performed in November 2015 which showed only mild nonobstructive coronary artery disease.  This patient has moderately good exercise tolerance and no symptoms of angina.  I feel this patient is low risk from a cardiac standpoint to proceed with his parotid facial surgery under general anesthesia."  Patient does not have PCP, all chronic medical conditions are followed by cardiologist Dr. Sondra Come.  Creatinine noted to be elevated on preop labs at 1.84.  Comparison labs from Dr. Everitt Amber office dated 10/05/2019 showed creatinine 1.52.  Remainder of preop labs unremarkable.  EKG 06/11/2020: Sinus rhythm with frequent Premature  ventricular complexes in a pattern of bigeminy.  Rate 64. RSR' or QR pattern in V1 suggests right ventricular conduction delay  24-hour Holter monitor 01/04/2015 (copy in chart): To rotation: 1.  Predominantly sinus rhythm throughout with extremely frequent isolated PVCs including cycles of ventricular bigeminy or trigeminy and rare isolated SVE's (HR 54-120, average 81 bpm). 2.  Of note, 20.5% of all QRS complexes during the monitoring period were ventricular ectopy. 3.  Rare short 3-4 beat SVE episodes.  No pauses. 4.  No symptoms were reported during the monitoring period.   Wynonia Musty [facility] Short Stay Center/Anesthesiology Phone [phone] 06/13/2020 4:01 PM

## 2020-06-13 NOTE — Anesthesia Preprocedure Evaluation (Addendum)
Anesthesia Evaluation  Patient identified by MRN, date of birth, ID band Patient awake    Reviewed: Allergy & Precautions, NPO status , Patient's Chart, lab work & pertinent test results, reviewed documented beta blocker date and time   Airway Mallampati: III  TM Distance: >3 FB Neck ROM: Full    Dental no notable dental hx. (+) Teeth Intact, Dental Advisory Given   Pulmonary Current Smoker and Patient abstained from smoking.,  50 pack year history 1 ppd currently    Pulmonary exam normal breath sounds clear to auscultation       Cardiovascular hypertension, Pt. on medications and Pt. on home beta blockers pulmonary hypertension (mild pHTN)+ CAD (non-obstructive) and +CHF (grade 1 diastolic dysfunction)  Normal cardiovascular exam+ dysrhythmias (PVCs) + Valvular Problems/Murmurs (mild AS/AI/MR) AS, AI and MR  Rhythm:Regular Rate:Normal  cardiologist Dr. Sondra Come for history of HTN, resolved nonischemic cardiomyopathy, mild aortic stenosis and aortic insufficiency, mild nonobstructive coronary disease, frequent PVCs, hyperlipidemia.  Per notes, history of mild nonobstructive coronary disease seen on cardiac catheterization performed in November 2015 with previously mildly reduced LV function (ejection fraction 45% before but 60% on TTE 12/2014). Last seen 10/05/2019.  Per note, he has good exercise tolerance with no symptoms of angina  Most recent echocardiogram was performed in December 2020 which revealed normal left ventricular size, wall thickness, wall motion, and function, EF 55 to 60%, mild grade 1 diastolic dysfunction, normal RV size and function, mild pulmonary hypertension with an estimated systolic PA pressure of 80-99 mmHg, and mild AS, AI, MR, TR, and PI.  EKG 06/11/2020: Sinus rhythm with frequent Premature ventricular complexes in a pattern of bigeminy.  Rate 64. RSR' or QR pattern in V1 suggests right ventricular conduction  delay  24-hour Holter monitor 01/04/2015 (copy in chart): To rotation: 1.  Predominantly sinus rhythm throughout with extremely frequent isolated PVCs including cycles of ventricular bigeminy or trigeminy and rare isolated SVE's (HR 54-120, average 81 bpm). 2.  Of note, 20.5% of all QRS complexes during the monitoring period were ventricular ectopy. 3.  Rare short 3-4 beat SVE episodes.  No pauses. 4.  No symptoms were reported during the monitoring period.   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 CKD? No known hx renal disease, has been taking dyazide per Dr. 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  No prior labs for comparison   negative genitourinary   Musculoskeletal  (+) Arthritis , Osteoarthritis,    Abdominal   Peds  Hematology negative hematology ROS (+) hct 47.5   Anesthesia Other Findings   Reproductive/Obstetrics negative OB ROS                          Anesthesia Physical Anesthesia Plan  ASA: III  Anesthesia Plan: General   Post-op Pain Management:    Induction: Intravenous and Rapid sequence  PONV Risk Score and Plan: 2 and Ondansetron, Dexamethasone, Midazolam and Treatment may vary due to age or medical condition  Airway Management Planned: Oral ETT and Video Laryngoscope Planned  Additional Equipment: None  Intra-op Plan:   Post-operative Plan: Extubation in OR  Informed Consent: I have reviewed the patients History and Physical, chart, labs and discussed the procedure including the risks, benefits and alternatives for the proposed anesthesia with the patient  or authorized representative who has indicated his/her understanding and acceptance.     Dental advisory given  Plan Discussed with: CRNA  Anesthesia Plan Comments:        Anesthesia Quick Evaluation

## 2020-06-18 ENCOUNTER — Observation Stay (HOSPITAL_COMMUNITY)
Admission: RE | Admit: 2020-06-18 | Discharge: 2020-06-19 | Disposition: A | Discharge status: Home / Self Care | Payer: Medicare Other | Attending: Otolaryngology | Admitting: Otolaryngology

## 2020-06-18 ENCOUNTER — Encounter (HOSPITAL_COMMUNITY)
Admission: RE | Disposition: A | Discharge status: Home / Self Care | Payer: Self-pay | Source: Home / Self Care | Attending: Otolaryngology

## 2020-06-18 ENCOUNTER — Ambulatory Visit (HOSPITAL_COMMUNITY): Payer: Medicare Other | Admitting: Anesthesiology

## 2020-06-18 ENCOUNTER — Encounter (HOSPITAL_COMMUNITY): Payer: Self-pay | Admitting: Otolaryngology

## 2020-06-18 ENCOUNTER — Other Ambulatory Visit: Payer: Self-pay

## 2020-06-18 ENCOUNTER — Ambulatory Visit (HOSPITAL_COMMUNITY): Payer: Medicare Other | Admitting: Physician Assistant

## 2020-06-18 DIAGNOSIS — Z20822 Contact with and (suspected) exposure to covid-19: Secondary | ICD-10-CM | POA: Insufficient documentation

## 2020-06-18 DIAGNOSIS — F17210 Nicotine dependence, cigarettes, uncomplicated: Secondary | ICD-10-CM | POA: Diagnosis not present

## 2020-06-18 DIAGNOSIS — D490 Neoplasm of unspecified behavior of digestive system: Secondary | ICD-10-CM | POA: Diagnosis present

## 2020-06-18 DIAGNOSIS — D110 Benign neoplasm of parotid gland: Secondary | ICD-10-CM | POA: Diagnosis present

## 2020-06-18 HISTORY — PX: PAROTIDECTOMY: SHX2163

## 2020-06-18 LAB — SARS CORONAVIRUS 2 BY RT PCR (HOSPITAL ORDER, PERFORMED IN ~~LOC~~ HOSPITAL LAB): SARS Coronavirus 2: NEGATIVE

## 2020-06-18 LAB — GLUCOSE, CAPILLARY: Glucose-Capillary: 128 mg/dL — ABNORMAL HIGH (ref 70–99)

## 2020-06-18 SURGERY — EXCISION, PAROTID GLAND
Anesthesia: General | Site: Face | Laterality: Left

## 2020-06-18 MED ORDER — ALBUTEROL SULFATE HFA 108 (90 BASE) MCG/ACT IN AERS
INHALATION_SPRAY | RESPIRATORY_TRACT | Status: DC | PRN
  Administered 2020-06-18: 6 via RESPIRATORY_TRACT

## 2020-06-18 MED ORDER — ONDANSETRON HCL 4 MG/2ML IJ SOLN
INTRAMUSCULAR | Status: DC | PRN
  Administered 2020-06-18: 4 mg via INTRAVENOUS

## 2020-06-18 MED ORDER — DEXAMETHASONE SODIUM PHOSPHATE 10 MG/ML IJ SOLN
INTRAMUSCULAR | Status: AC
  Filled 2020-06-18: qty 1

## 2020-06-18 MED ORDER — CHLORHEXIDINE GLUCONATE CLOTH 2 % EX PADS: 6.0000 | MEDICATED_PAD | Freq: Once | CUTANEOUS | Status: DC

## 2020-06-18 MED ORDER — DEXAMETHASONE SODIUM PHOSPHATE 10 MG/ML IJ SOLN
INTRAMUSCULAR | Status: DC | PRN
  Administered 2020-06-18: 10 mg via INTRAVENOUS

## 2020-06-18 MED ORDER — FENTANYL CITRATE (PF) 250 MCG/5ML IJ SOLN
INTRAMUSCULAR | Status: AC
  Filled 2020-06-18: qty 5

## 2020-06-18 MED ORDER — HYDROMORPHONE HCL 1 MG/ML IJ SOLN: 0.2500 mg | INTRAMUSCULAR | Status: DC | PRN

## 2020-06-18 MED ORDER — MIDAZOLAM HCL 5 MG/5ML IJ SOLN
INTRAMUSCULAR | Status: DC | PRN
  Administered 2020-06-18 (×2): 1 mg via INTRAVENOUS

## 2020-06-18 MED ORDER — ACETAMINOPHEN 160 MG/5ML PO SOLN: 650.0000 mg | ORAL | Status: DC | PRN

## 2020-06-18 MED ORDER — PROPOFOL 10 MG/ML IV BOLUS
INTRAVENOUS | Status: DC | PRN
  Administered 2020-06-18: 130 mg via INTRAVENOUS

## 2020-06-18 MED ORDER — HYDROCODONE-ACETAMINOPHEN 5-325 MG PO TABS: 1.0000 | ORAL_TABLET | ORAL | Status: DC | PRN

## 2020-06-18 MED ORDER — 0.9 % SODIUM CHLORIDE (POUR BTL) OPTIME
TOPICAL | Status: DC | PRN
  Administered 2020-06-18: 1000 mL

## 2020-06-18 MED ORDER — CEFAZOLIN SODIUM-DEXTROSE 2-4 GM/100ML-% IV SOLN
2.0000 g | INTRAVENOUS | Status: AC
Start: 2020-06-18 — End: 2020-06-18
  Administered 2020-06-18: 2 g via INTRAVENOUS
  Filled 2020-06-18: qty 100

## 2020-06-18 MED ORDER — ARTIFICIAL TEARS OPHTHALMIC OINT
TOPICAL_OINTMENT | OPHTHALMIC | Status: AC
  Filled 2020-06-18: qty 3.5

## 2020-06-18 MED ORDER — ORAL CARE MOUTH RINSE: 15.0000 mL | Freq: Once | OROMUCOSAL | Status: AC

## 2020-06-18 MED ORDER — METOPROLOL TARTRATE 25 MG PO TABS
25.0000 mg | ORAL_TABLET | Freq: Every day | ORAL | Status: DC
  Administered 2020-06-19: 25 mg via ORAL
  Filled 2020-06-18 (×2): qty 1

## 2020-06-18 MED ORDER — OXYCODONE HCL 5 MG/5ML PO SOLN
5.0000 mg | Freq: Once | ORAL | Status: DC | PRN
Start: 2020-06-18 — End: 2020-06-18

## 2020-06-18 MED ORDER — LIDOCAINE-EPINEPHRINE 1 %-1:100000 IJ SOLN
INTRAMUSCULAR | Status: AC
  Filled 2020-06-18: qty 1

## 2020-06-18 MED ORDER — KCL IN DEXTROSE-NACL 20-5-0.45 MEQ/L-%-% IV SOLN
INTRAVENOUS | Status: DC
  Filled 2020-06-18: qty 1000

## 2020-06-18 MED ORDER — ACETAMINOPHEN 500 MG PO TABS
1000.0000 mg | ORAL_TABLET | Freq: Once | ORAL | Status: AC
  Administered 2020-06-18: 1000 mg via ORAL
  Filled 2020-06-18: qty 2

## 2020-06-18 MED ORDER — ACETAMINOPHEN 650 MG RE SUPP: 650.0000 mg | RECTAL | Status: DC | PRN

## 2020-06-18 MED ORDER — CHLORHEXIDINE GLUCONATE 0.12 % MT SOLN
15.0000 mL | Freq: Once | OROMUCOSAL | Status: AC
  Administered 2020-06-18: 15 mL via OROMUCOSAL
  Filled 2020-06-18: qty 15

## 2020-06-18 MED ORDER — PROPOFOL 10 MG/ML IV BOLUS
INTRAVENOUS | Status: AC
  Filled 2020-06-18: qty 20

## 2020-06-18 MED ORDER — LACTATED RINGERS IV SOLN: INTRAVENOUS | Status: DC

## 2020-06-18 MED ORDER — CEFAZOLIN SODIUM-DEXTROSE 1-4 GM/50ML-% IV SOLN
1.0000 g | Freq: Three times a day (TID) | INTRAVENOUS | Status: DC
  Administered 2020-06-19 (×2): 1 g via INTRAVENOUS
  Filled 2020-06-18 (×3): qty 50

## 2020-06-18 MED ORDER — EPHEDRINE SULFATE-NACL 50-0.9 MG/10ML-% IV SOSY
PREFILLED_SYRINGE | INTRAVENOUS | Status: DC | PRN
  Administered 2020-06-18: 5 mg via INTRAVENOUS
  Administered 2020-06-18: 10 mg via INTRAVENOUS
  Administered 2020-06-18: 5 mg via INTRAVENOUS

## 2020-06-18 MED ORDER — TRIAMTERENE-HCTZ 37.5-25 MG PO TABS
1.0000 | ORAL_TABLET | Freq: Every day | ORAL | Status: DC
  Administered 2020-06-19: 1 via ORAL
  Filled 2020-06-18 (×2): qty 1

## 2020-06-18 MED ORDER — OXYCODONE HCL 5 MG PO TABS: 5.0000 mg | ORAL_TABLET | Freq: Once | ORAL | Status: DC | PRN

## 2020-06-18 MED ORDER — SUCCINYLCHOLINE CHLORIDE 200 MG/10ML IV SOSY
PREFILLED_SYRINGE | INTRAVENOUS | Status: AC
  Filled 2020-06-18: qty 10

## 2020-06-18 MED ORDER — ONDANSETRON HCL 4 MG/2ML IJ SOLN: 4.0000 mg | Freq: Once | INTRAMUSCULAR | Status: DC | PRN

## 2020-06-18 MED ORDER — MORPHINE SULFATE (PF) 2 MG/ML IV SOLN: 2.0000 mg | INTRAVENOUS | Status: DC | PRN

## 2020-06-18 MED ORDER — MUPIROCIN 2 % EX OINT
TOPICAL_OINTMENT | CUTANEOUS | Status: AC
  Filled 2020-06-18: qty 22

## 2020-06-18 MED ORDER — TRIAMTERENE-HCTZ 37.5-25 MG PO CAPS: 1.0000 | ORAL_CAPSULE | Freq: Every day | ORAL | Status: DC

## 2020-06-18 MED ORDER — LIDOCAINE HCL (CARDIAC) PF 100 MG/5ML IV SOSY
PREFILLED_SYRINGE | INTRAVENOUS | Status: DC | PRN
  Administered 2020-06-18: 50 mg via INTRAVENOUS
  Administered 2020-06-18: 40 mg via INTRAVENOUS

## 2020-06-18 MED ORDER — BACITRACIN ZINC 500 UNIT/GM EX OINT
1.0000 "application " | TOPICAL_OINTMENT | Freq: Three times a day (TID) | CUTANEOUS | Status: DC
  Administered 2020-06-18 – 2020-06-19 (×2): 1 via TOPICAL
  Filled 2020-06-18: qty 28.35

## 2020-06-18 MED ORDER — FENTANYL CITRATE (PF) 100 MCG/2ML IJ SOLN
INTRAMUSCULAR | Status: DC | PRN
  Administered 2020-06-18: 100 ug via INTRAVENOUS
  Administered 2020-06-18: 50 ug via INTRAVENOUS
  Administered 2020-06-18: 100 ug via INTRAVENOUS

## 2020-06-18 MED ORDER — MIDAZOLAM HCL 2 MG/2ML IJ SOLN
INTRAMUSCULAR | Status: AC
  Filled 2020-06-18: qty 2

## 2020-06-18 MED ORDER — PROPOFOL 1000 MG/100ML IV EMUL
INTRAVENOUS | Status: AC
  Filled 2020-06-18: qty 100

## 2020-06-18 MED ORDER — ONDANSETRON HCL 4 MG/2ML IJ SOLN
INTRAMUSCULAR | Status: AC
  Filled 2020-06-18: qty 2

## 2020-06-18 MED ORDER — PHENYLEPHRINE HCL-NACL 10-0.9 MG/250ML-% IV SOLN
INTRAVENOUS | Status: DC | PRN
  Administered 2020-06-18: 50 ug/min via INTRAVENOUS

## 2020-06-18 MED ORDER — MUPIROCIN 2 % EX OINT
TOPICAL_OINTMENT | CUTANEOUS | Status: DC | PRN
  Administered 2020-06-18: 1 via TOPICAL

## 2020-06-18 MED ORDER — SUCCINYLCHOLINE CHLORIDE 200 MG/10ML IV SOSY
PREFILLED_SYRINGE | INTRAVENOUS | Status: DC | PRN
  Administered 2020-06-18: 140 mg via INTRAVENOUS

## 2020-06-18 MED ORDER — ALBUTEROL SULFATE HFA 108 (90 BASE) MCG/ACT IN AERS
INHALATION_SPRAY | RESPIRATORY_TRACT | Status: AC
  Filled 2020-06-18: qty 6.7

## 2020-06-18 MED ORDER — LIDOCAINE-EPINEPHRINE 1 %-1:100000 IJ SOLN
INTRAMUSCULAR | Status: DC | PRN
  Administered 2020-06-18: 5 mL

## 2020-06-18 SURGICAL SUPPLY — 57 items
ATTRACTOMAT 16X20 MAGNETIC DRP (DRAPES) ×2 IMPLANT
BLADE CLIPPER SURG (BLADE) IMPLANT
BLADE SURG 15 STRL LF DISP TIS (BLADE) IMPLANT
BLADE SURG 15 STRL SS (BLADE)
BNDG GAUZE ELAST 4 BULKY (GAUZE/BANDAGES/DRESSINGS) IMPLANT
CANISTER SUCT 3000ML PPV (MISCELLANEOUS) ×2 IMPLANT
CLEANER TIP ELECTROSURG 2X2 (MISCELLANEOUS) ×2 IMPLANT
CNTNR URN SCR LID CUP LEK RST (MISCELLANEOUS) ×1 IMPLANT
CONT SPEC 4OZ STRL OR WHT (MISCELLANEOUS) ×1
CORD BIPOLAR FORCEPS 12FT (ELECTRODE) ×2 IMPLANT
COVER SURGICAL LIGHT HANDLE (MISCELLANEOUS) ×2 IMPLANT
DRAIN JP 10F RND RADIO (DRAIN) ×2 IMPLANT
DRAIN SNY 10 ROU (WOUND CARE) IMPLANT
DRAPE HALF SHEET 40X57 (DRAPES) IMPLANT
DRAPE SURG 17X11 SM STRL (DRAPES) ×2 IMPLANT
ELECT COATED BLADE 2.86 ST (ELECTRODE) ×2 IMPLANT
ELECT EMG 18 NIMS (NEUROSURGERY SUPPLIES)
ELECT REM PT RETURN 9FT ADLT (ELECTROSURGICAL) ×2
ELECTRODE EMG 18 NIMS (NEUROSURGERY SUPPLIES) IMPLANT
ELECTRODE REM PT RTRN 9FT ADLT (ELECTROSURGICAL) ×1 IMPLANT
EVACUATOR SILICONE 100CC (DRAIN) ×2 IMPLANT
FORCEPS BIPOLAR SPETZLER 8 1.0 (NEUROSURGERY SUPPLIES) ×2 IMPLANT
GAUZE SPONGE 4X4 12PLY STRL (GAUZE/BANDAGES/DRESSINGS) ×2 IMPLANT
GLOVE SURG POLYISO LF SZ7.5 (GLOVE) ×2 IMPLANT
GOWN STRL REUS W/ TWL LRG LVL3 (GOWN DISPOSABLE) ×1 IMPLANT
GOWN STRL REUS W/ TWL XL LVL3 (GOWN DISPOSABLE) ×1 IMPLANT
GOWN STRL REUS W/TWL LRG LVL3 (GOWN DISPOSABLE) ×1
GOWN STRL REUS W/TWL XL LVL3 (GOWN DISPOSABLE) ×1
KIT BASIN OR (CUSTOM PROCEDURE TRAY) ×2 IMPLANT
KIT TURNOVER KIT B (KITS) ×2 IMPLANT
LOCATOR NERVE 3 VOLT (DISPOSABLE) IMPLANT
NEEDLE PRECISIONGLIDE 27X1.5 (NEEDLE) ×2 IMPLANT
NS IRRIG 1000ML POUR BTL (IV SOLUTION) ×2 IMPLANT
PAD ARMBOARD 7.5X6 YLW CONV (MISCELLANEOUS) ×4 IMPLANT
PENCIL BUTTON HOLSTER BLD 10FT (ELECTRODE) ×2 IMPLANT
PROBE NERVBE PRASS .33 (MISCELLANEOUS) IMPLANT
SPONGE INTESTINAL PEANUT (DISPOSABLE) ×2 IMPLANT
SPONGE LAP 18X18 RF (DISPOSABLE) ×2 IMPLANT
STAPLER VISISTAT 35W (STAPLE) ×2 IMPLANT
SUT CHROMIC 3 0 PS 2 (SUTURE) ×2 IMPLANT
SUT ETHILON 3 0 FSL (SUTURE) ×2 IMPLANT
SUT ETHILON 5 0 P 3 18 (SUTURE) ×1
SUT ETHILON 6 0 9-3 1X18 BLK (SUTURE) IMPLANT
SUT ETHILON 8 0 BV130 4 (SUTURE) IMPLANT
SUT NYLON ETHILON 5-0 P-3 1X18 (SUTURE) ×1 IMPLANT
SUT SILK 2 0 (SUTURE)
SUT SILK 2 0 PERMA HAND 18 BK (SUTURE) ×2 IMPLANT
SUT SILK 2-0 18XBRD TIE 12 (SUTURE) IMPLANT
SUT SILK 3 0 (SUTURE) ×1
SUT SILK 3-0 18XBRD TIE 12 (SUTURE) ×1 IMPLANT
SUT SILK 4 0 (SUTURE)
SUT SILK 4-0 18XBRD TIE 12 (SUTURE) IMPLANT
SUT VIC AB 3-0 FS2 27 (SUTURE) IMPLANT
TAPE CLOTH SURG 4X10 WHT LF (GAUZE/BANDAGES/DRESSINGS) ×2 IMPLANT
TOWEL GREEN STERILE FF (TOWEL DISPOSABLE) ×2 IMPLANT
TRAY ENT MC OR (CUSTOM PROCEDURE TRAY) ×2 IMPLANT
WATER STERILE IRR 1000ML POUR (IV SOLUTION) IMPLANT

## 2020-06-18 NOTE — Anesthesia Postprocedure Evaluation (Signed)
Anesthesia Post Note  Patient: Khaleem M Terrones  Procedure(s) Performed: PAROTIDECTOMY WITH FACIAL NERVE DISSECTION (Left: Face)     Patient location during evaluation: PACU Anesthesia Type: General Level of consciousness: awake and alert Pain management: pain level controlled Vital Signs Assessment: post-procedure vital signs reviewed and stable Respiratory status: spontaneous breathing, nonlabored ventilation, respiratory function stable and patient connected to nasal cannula oxygen Cardiovascular status: blood pressure returned to baseline and stable Postop Assessment: no apparent nausea or vomiting Anesthetic complications: no   No notable events documented.  Last Vitals:  Vitals:   06/18/20 1230 06/18/20 1245  BP: 105/65 123/65  Pulse: 93 94  Resp: 15 14  Temp:    SpO2: 95% 95%    Last Pain:  Vitals:   06/18/20 1245  TempSrc:   PainSc: 2                  Lendy Dittrich,W. EDMOND

## 2020-06-18 NOTE — Progress Notes (Signed)
Postop check Patient awake and alert with normal facial nerve function. He is eating and drinking well. JP output has been moderate with approximately 15-20 cc of serosanguineous fluid in the JP bulb. Wound is doing well with no significant swelling. Reviewed with the patient as well as his wife concerning preop elevation of his creatinine to 1.8 and that this should be followed postoperatively with his medical physician. He is receiving Ancef postoperatively. We will plan on removing the JP bulb and discharging patient in the morning.

## 2020-06-18 NOTE — Interval H&P Note (Signed)
History and Physical Interval Note:  06/18/2020 9:33 AM  [place]  has presented today for surgery, with the diagnosis of PARODID MASS.  The various methods of treatment have been discussed with the patient and family. After consideration of risks, benefits and other options for treatment, the patient has consented to  Procedure(s): PAROTIDECTOMY (N/A) as a surgical intervention.  The patient's history has been reviewed, patient examined, no change in status, stable for surgery.  I have reviewed the patient's chart and labs.  Questions were answered to the patient's satisfaction.     Daniel Pierce

## 2020-06-18 NOTE — Anesthesia Procedure Notes (Signed)
Procedure Name: Intubation Date/Time: 06/18/2020 10:19 AM Performed by: Jonna Munro, CRNA Pre-anesthesia Checklist: Patient identified, Patient being monitored, Timeout performed, Emergency Drugs available and Suction available Patient Re-evaluated:Patient Re-evaluated prior to induction Oxygen Delivery Method: Circle System Utilized Preoxygenation: Pre-oxygenation with 100% oxygen Induction Type: IV induction, Rapid sequence and Cricoid Pressure applied Ventilation: Mask ventilation without difficulty Laryngoscope Size: Mac, Glidescope and 4 Grade View: Grade I Tube type: Oral Tube size: 7.5 mm Number of attempts: 1 Airway Equipment and Method: Stylet and Video-laryngoscopy Placement Confirmation: ETT inserted through vocal cords under direct vision, positive ETCO2 and breath sounds checked- equal and bilateral Secured at: 23 cm Tube secured with: Tape Dental Injury: Teeth and Oropharynx as per pre-operative assessment

## 2020-06-18 NOTE — Brief Op Note (Signed)
06/18/2020  12:07 PM  PATIENT:  Daniel Pierce  70 y.o. male  PRE-OPERATIVE DIAGNOSIS:  PARODID MASS LEFT  POST-OPERATIVE DIAGNOSIS:  PARODID MASS  PROCEDURE:  Procedure(s): PAROTIDECTOMY WITH FACIAL NERVE DISSECTION (Left)  SURGEON:  Surgeon(s) and Role:    Rozetta Nunnery, MD - Primary  PHYSICIAN ASSISTANT:   ASSISTANTS: none   ANESTHESIA:   general  EBL:  50 mL   BLOOD ADMINISTERED:none  DRAINS: (10 Fr) Jackson-Pratt drain(s) with closed bulb suction in the left parotid bed    LOCAL MEDICATIONS USED:  XYLOCAINE   SPECIMEN:  Source of Specimen:  left parotid mass and Excision  DISPOSITION OF SPECIMEN:  PATHOLOGY  COUNTS:  YES  TOURNIQUET:  * No tourniquets in log *  DICTATION: .Other Dictation: Dictation Number [id number]  PLAN OF CARE: Admit for overnight observation  PATIENT DISPOSITION:  PACU - hemodynamically stable.   Delay start of Pharmacological VTE agent (>24hrs) due to surgical blood loss or risk of bleeding: yes

## 2020-06-18 NOTE — Op Note (Signed)
NAME: Daniel Pierce, Daniel Pierce MEDICAL RECORD NO: [id number] ACCOUNT NO: [id number] DATE OF BIRTH: 1950/10/20 FACILITY: [facility] LOCATION: [facility] PHYSICIAN: Leonides Sake. 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  OPERATION PERFORMED:  Left superficial parotidectomy with excision of left parotid mass and facial nerve dissection.  SURGEON:  Melony Overly, MD  TYPE OF ANESTHESIA:  General endotracheal.  BLOOD LOSS:  50 mL.  COMPLICATIONS:  None.  BRIEF CLINICAL NOTE:  The patient is a 70 year old gentleman who has had a left parotid mass for several years now.  He states that it fluctuates in size and sometimes causes symptoms when it is larger.  He wanted to have it removed.  I discussed with  him that fine-needle aspirate was consistent with a Warthin's tumor and that this is a benign tumor and does not have to necessary be removed, but he feels like it is causing him more problems and getting larger.  DESCRIPTION OF PROCEDURE:  After adequate endotracheal anesthesia, the left parotid mass was easily palpable.  The area of parotid incision was marked out and injected with Xylocaine with epinephrine for local anesthesia and hemostasis.  The left neck  was prepped with Betadine solution, draped off in sterile towels.  A standard parotid incision was made.  Dissection was carried out first anteriorly over the superficial parotid gland.  The mass was located in the inferior aspect of the parotid gland  deep to the tail of the parotid.  Following elevation of the anterior skin over the parotid fascia, posterior dissection was carried out.  This was carried out over the mass down to the sternocleidomastoid muscle posteriorly.  The posterior portion of  this attached  close to the sternocleidomastoid muscle, and the posterior portion of the parotid mass was dissected off of the sternocleidomastoid muscle.  Next, dissection was carried down along the cartilage of the ear to locate the facial nerve trunk.   In this area, the facial nerve trunk was found in its normal anatomical position just lateral and inferior to the styloid process and inferiorly deep to the pointer.  The facial nerve trunk was identified and preserved throughout the dissection.  The  facial nerve trunk was dissected out until it divided.  No further dissection was performed along the superior branches of the facial nerve.  The inferior branches of the facial nerve were dissected out as they went deep and anterior to the parotid mass,  but then on dissecting the parotid mass off of the sternocleidomastoid muscle and off of the deep parotid area, the mass actually went underneath the inferior branches of the facial nerve.  This mass appeared more cystic and was not adherent to the  nerve.  This was bluntly dissected off of the lower branches of the facial nerve, which were preserved throughout the dissection.  Some of the superficial normal-appearing parotid gland superficial to the parotid mass was left with the facial nerve.  The  deep parotid mass was totally removed along with some surrounding overlying normal parotid gland.  Hemostasis was obtained with bipolar cautery. In addition, 2 large veins were ligated inferiorly with 2-0 silk suture down to close to the facial nerve  trunk.  After obtaining adequate hemostasis, the wound was closed with 3-0 chromic suture subcutaneously and 5-0 nylon to reapproximate the skin edges.  A  10-French JP drain was brought out through a separate stab incision posterior to the ear lobe after  closing the skin with 5-0 nylon suture.  Mupirocin ointment was applied followed by gauze dressing.  The patient tolerated this well, was awoken from anesthesia, and transferred  to recovery room postoperatively doing well.  DISPOSITION:  The patient will be observed overnight for monitoring his JP drain output and we will plan on removing the JP drain tomorrow morning and discharge him home.  Specimen was sent to pathology.   ROH D: 06/18/2020 12:16:54 pm T: 06/18/2020 1:16:00 pm  JOB: [id number]/ [id number]

## 2020-06-18 NOTE — Progress Notes (Signed)
Daniel Pierce is a 70 y.o. male patient admitted. Awake, alert - oriented  X 4 - no acute distress noted.  VSS - Blood pressure 113/66, pulse 87, temperature 97.7 F (36.5 C), temperature source Oral, resp. rate 17, height 6\' 1"  (1.854 m), weight 90.9 kg, SpO2 94 %.    IV in place, occlusive dsg intact without redness.Dressing to surgical site intact and clean.  Orientation to room, and floor completed.  Admission INP armband ID verified with patient/family, and in place.   SR up x 2, fall assessment complete, with patient and family able to verbalize understanding of risk associated with falls, and verbalized understanding to call nsg before up out of bed.  Call light within reach, patient able to voice, and demonstrate understanding. No evidence of skin break down noted on exam     Will cont to eval and treat per MD orders.  [name], RN 06/18/2020 9:03 PM

## 2020-06-18 NOTE — Transfer of Care (Signed)
Immediate Anesthesia Transfer of Care Note  Patient: Daniel Pierce  Procedure(s) Performed: PAROTIDECTOMY WITH FACIAL NERVE DISSECTION (Left: Face)  Patient Location: PACU  Anesthesia Type:General  Level of Consciousness: awake, alert , oriented and patient cooperative  Airway & Oxygen Therapy: Patient Spontanous Breathing and Patient connected to face mask oxygen  Post-op Assessment: Report given to RN, Post -op Vital signs reviewed and stable and Patient moving all extremities X 4  Post vital signs: Reviewed and stable  Last Vitals:  Vitals Value Taken Time  BP 109/62 06/18/20 1217  Temp    Pulse 95 06/18/20 1217  Resp 14 06/18/20 1217  SpO2 97 % 06/18/20 1217  Vitals shown include unvalidated device data.  Last Pain:  Vitals:   06/18/20 0818  TempSrc:   PainSc: 4       Patients Stated Pain Goal: 2 (01/41/03 0131)  Complications: No notable events documented.

## 2020-06-19 ENCOUNTER — Encounter (HOSPITAL_COMMUNITY): Payer: Self-pay | Admitting: Otolaryngology

## 2020-06-19 DIAGNOSIS — D110 Benign neoplasm of parotid gland: Secondary | ICD-10-CM | POA: Diagnosis not present

## 2020-06-19 LAB — SURGICAL PATHOLOGY

## 2020-06-19 NOTE — Progress Notes (Signed)
POD 1 AF VSS Patient doing well last night Patient with normal facial nerve function. JP output is minimal and serous and was removed at bedside. He has no swelling. Patient is discharged home this morning on his regular medications as well as Tylenol and ibuprofen as needed pain. Is instructed apply antibiotic ointment to incision site daily. He will follow-up in my office next Monday at 4:30 to have sutures removed.  Discharge dictated # [id number]

## 2020-06-19 NOTE — Discharge Summary (Signed)
NAME: Daniel Pierce, Daniel Pierce MEDICAL RECORD NO: [id number] ACCOUNT NO: [id number] DATE OF BIRTH: 1950-06-15 FACILITY: [facility] LOCATION: [facility] PHYSICIAN: Leonides Sake. Lucia Gaskins, MD  Discharge Summary   DATE OF DISCHARGE: 06/19/2020  DIAGNOSIS:  Left parotid tumor.  OPERATIONS DURING THIS HOSPITALIZATION:  Left superficial parotidectomy with removal of left parotid tumor and facial nerve dissection performed on 06/18/2020.  HOSPITAL COURSE:  The patient was admitted via the operating room on 06/18/2020, at which time he underwent a left parotidectomy with facial nerve dissection to remove a left parotid tumor.  He did well with the surgery.  He received perioperative Ancef  1 gram IV q.8 h.  He had normal facial nerve function following the surgery.  He had a JP drain that was monitored overnight with moderate output initially and then down to minimal output with just a serous effusion that was removed on his first  postoperative day on 06/19/2020.  At that time, there was no swelling over the parotid area.  No significant bleeding and the patient was subsequently discharged home after removal of the drain.  DISPOSITION:  The patient discharged home on his normal medications including Tylenol and ibuprofen p.r.n. pain.  He will follow up in my office in one week on Monday to have his sutures removed and review final pathology.   SHW D: 06/19/2020 9:30:40 am T: 06/19/2020 9:38:00 am  JOB: [id number]/ [id number]

## 2020-06-19 NOTE — Plan of Care (Signed)
  Problem: Education: Goal: Knowledge of General Education information will improve Description: Including pain rating scale, medication(s)/side effects and non-pharmacologic comfort measures 06/19/2020 0950 by Camillia Herter, RN Outcome: Adequate for Discharge 06/19/2020 0949 by Camillia Herter, RN Outcome: Progressing   Problem: Health Behavior/Discharge Planning: Goal: Ability to manage health-related needs will improve 06/19/2020 0950 by Camillia Herter, RN Outcome: Adequate for Discharge 06/19/2020 0949 by Camillia Herter, RN Outcome: Progressing   Problem: Clinical Measurements: Goal: Ability to maintain clinical measurements within normal limits will improve 06/19/2020 0950 by Camillia Herter, RN Outcome: Adequate for Discharge 06/19/2020 0949 by Camillia Herter, RN Outcome: Progressing Goal: Will remain free from infection 06/19/2020 0950 by Camillia Herter, RN Outcome: Adequate for Discharge 06/19/2020 0949 by Camillia Herter, RN Outcome: Progressing Goal: Diagnostic test results will improve 06/19/2020 0950 by Camillia Herter, RN Outcome: Adequate for Discharge 06/19/2020 0949 by Camillia Herter, RN Outcome: Progressing Goal: Respiratory complications will improve 06/19/2020 0950 by Camillia Herter, RN Outcome: Adequate for Discharge 06/19/2020 0949 by Camillia Herter, RN Outcome: Progressing Goal: Cardiovascular complication will be avoided 06/19/2020 0950 by Camillia Herter, RN Outcome: Adequate for Discharge 06/19/2020 0949 by Camillia Herter, RN Outcome: Progressing   Problem: Activity: Goal: Risk for activity intolerance will decrease 06/19/2020 0950 by Camillia Herter, RN Outcome: Adequate for Discharge 06/19/2020 0949 by Camillia Herter, RN Outcome: Progressing   Problem: Nutrition: Goal: Adequate nutrition will be maintained 06/19/2020 0950 by Camillia Herter, RN Outcome: Adequate for Discharge 06/19/2020 0949 by Camillia Herter, RN Outcome:  Progressing   Problem: Coping: Goal: Level of anxiety will decrease 06/19/2020 0950 by Camillia Herter, RN Outcome: Adequate for Discharge 06/19/2020 0949 by Camillia Herter, RN Outcome: Progressing   Problem: Elimination: Goal: Will not experience complications related to bowel motility 06/19/2020 0950 by Camillia Herter, RN Outcome: Adequate for Discharge 06/19/2020 0949 by Camillia Herter, RN Outcome: Progressing Goal: Will not experience complications related to urinary retention 06/19/2020 0950 by Camillia Herter, RN Outcome: Adequate for Discharge 06/19/2020 0949 by Camillia Herter, RN Outcome: Progressing   Problem: Pain Managment: Goal: General experience of comfort will improve 06/19/2020 0950 by Camillia Herter, RN Outcome: Adequate for Discharge 06/19/2020 0949 by Camillia Herter, RN Outcome: Progressing   Problem: Safety: Goal: Ability to remain free from injury will improve 06/19/2020 0950 by Camillia Herter, RN Outcome: Adequate for Discharge 06/19/2020 0949 by Camillia Herter, RN Outcome: Progressing   Problem: Skin Integrity: Goal: Risk for impaired skin integrity will decrease 06/19/2020 0950 by Camillia Herter, RN Outcome: Adequate for Discharge 06/19/2020 0949 by Camillia Herter, RN Outcome: Progressing

## 2020-06-19 NOTE — Plan of Care (Signed)

## 2020-06-19 NOTE — Progress Notes (Signed)
Daniel Pierce to be D/C'd  per MD order.  Discussed with the patient and all questions fully answered.  VSS, Skin clean, dry and intact without evidence of skin break down, no evidence of skin tears noted.  IV catheter discontinued intact. Site without signs and symptoms of complications. Dressing and pressure applied.  An After Visit Summary was printed and given to the patient. Patient received prescription.  D/c education completed with patient/family including follow up instructions, medication list, d/c activities limitations if indicated, with other d/c instructions as indicated by MD - patient able to verbalize understanding, all questions fully answered.   Patient instructed to return to ED, call 911, or call MD for any changes in condition.   Patient to be escorted via [place], and D/C home via private auto.

## 2020-06-19 NOTE — Discharge Instructions (Signed)
Keep incision site dry for the next 24hours. May then get it wet and clean the area with soap and water. Apply antibiotic ointment to incision site daily Take your regular medications and Tylenol or ibuprofen as needed pain. Follow-up in my office next Monday at 4:30 to have sutures removed. Call office if any problems or questions [phone].

## 2020-06-25 ENCOUNTER — Other Ambulatory Visit: Payer: Self-pay

## 2020-06-25 ENCOUNTER — Ambulatory Visit (INDEPENDENT_AMBULATORY_CARE_PROVIDER_SITE_OTHER): Payer: Medicare Other | Admitting: Otolaryngology

## 2020-06-25 DIAGNOSIS — Z4889 Encounter for other specified surgical aftercare: Secondary | ICD-10-CM

## 2020-06-25 NOTE — Progress Notes (Signed)
HPI: Daniel Pierce is a 70 y.o. male who presents 1 week days s/p left parotidectomy with facial nerve dissection.  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  Patient is doing well..   Past Medical History:  Diagnosis Date   Arthritis    Dysrhythmia    PVC's   Past Surgical History:  Procedure Laterality Date   BACK SURGERY     FOOT SURGERY Right    PAROTIDECTOMY Left 06/18/2020   Procedure: PAROTIDECTOMY WITH FACIAL NERVE DISSECTION;  Surgeon: Rozetta Nunnery, MD;  Location: [facility] OR;  Service: ENT;  Laterality: Left;   Social History   Socioeconomic History   Marital status: Married    Spouse name: Not on file   Number of children: Not on file   Years of education: Not on file   Highest education level: Not on file  Occupational History   Not on file  Tobacco Use   Smoking status: Every Day    Packs/day: 1.00    Years: 50.00    Pack years: 50.00    Types: Cigarettes    Start date: 1970   Smokeless tobacco: Never  Vaping Use   Vaping Use: Never used  Substance and Sexual Activity   Alcohol use: Not Currently   Drug use: Never   Sexual activity: Not on file  Other Topics Concern   Not on file  Social History Narrative   Not on file   Social Determinants of Health   Financial Resource Strain: Not on file  Food Insecurity: Not on file  Transportation Needs: Not on file  Physical Activity: Not on file  Stress: Not on file  Social Connections: Not on file   No family history on file. No Known Allergies Prior to Admission medications   Medication Sig Start Date End Date Taking? Authorizing Provider  acetaminophen (TYLENOL) 650 MG CR tablet Take 650 mg by mouth 2 (two) times daily as needed for pain.    [provider]  metoprolol tartrate (LOPRESSOR) 25 MG tablet Take 25 mg by mouth daily after supper. 02/14/15   [provider]  triamterene-hydrochlorothiazide (DYAZIDE) 37.5-25 MG capsule TAKE 1 EACH (1 CAPSULE TOTAL) BY  MOUTH DAILY. 10/19/19   Rozetta Nunnery, MD     Physical Exam: Sutures were removed in the office today.  There are no signs of infection and no swelling.  He has normal facial nerve function.   Assessment: S/p left superficial parotidectomy for removal of left Warthin's tumor.  Plan: He will follow-up as needed.   Radene Journey, MD

## 2020-07-07 ENCOUNTER — Other Ambulatory Visit (INDEPENDENT_AMBULATORY_CARE_PROVIDER_SITE_OTHER): Payer: Self-pay | Admitting: Otolaryngology

## 2020-07-16 ENCOUNTER — Encounter (INDEPENDENT_AMBULATORY_CARE_PROVIDER_SITE_OTHER): Payer: Medicare Other | Admitting: Otolaryngology

## 2020-07-16 ENCOUNTER — Telehealth (INDEPENDENT_AMBULATORY_CARE_PROVIDER_SITE_OTHER): Payer: Self-pay | Admitting: Otolaryngology

## 2020-07-16 ENCOUNTER — Other Ambulatory Visit (INDEPENDENT_AMBULATORY_CARE_PROVIDER_SITE_OTHER): Payer: Self-pay | Admitting: Otolaryngology

## 2020-07-16 MED ORDER — TRIAMTERENE-HCTZ 37.5-25 MG PO CAPS: 1.0000 | ORAL_CAPSULE | Freq: Every day | ORAL | 6 refills | Status: DC

## 2020-07-16 NOTE — Telephone Encounter (Signed)
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  This was sent to his [facility] in [place].

## 2020-10-04 DIAGNOSIS — I359 Nonrheumatic aortic valve disorder, unspecified: Secondary | ICD-10-CM | POA: Insufficient documentation

## 2020-10-04 DIAGNOSIS — F17200 Nicotine dependence, unspecified, uncomplicated: Secondary | ICD-10-CM | POA: Insufficient documentation

## 2021-02-28 IMAGING — MR MR BRAIN/IAC WO/W CM
11 of 12 series · 43 of 48 positions shown · IV contrast (multihance)
Comparison: No pertinent prior studies available for comparison.

CLINICAL DATA: Sensorineural hearing loss of left ear with
unrestricted hearing of right ear. Hearing loss, sensorineural.
Additional history provided by technologist: Chronic and persistent
hearing loss and ringing in left ear.

Creatinine was obtained on site at [HOSPITAL] at [HOSPITAL].
Results: Creatinine 2.1 mg/dL (GFR 32).
EXAM:
MRI HEAD WITHOUT AND WITH CONTRAST
TECHNIQUE: Multiplanar, multiecho pulse sequences of the brain and surrounding
structures were obtained without and with intravenous contrast.
CONTRAST:  9mL MULTIHANCE GADOBENATE DIMEGLUMINE 529 MG/ML IV SOLN

[Series 5: T1 · sagittal · 4.0mm · 0.72mm/px · 3 of 28 slices shown (1 of 3)]
[im 1/28]
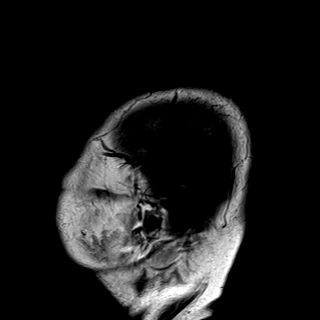
[im 14/28]
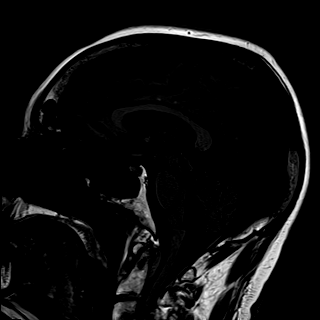
[im 28/28]
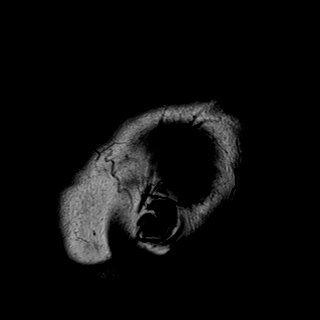

[Series 6: DWI · axial · 3.0mm · 1.44mm/px · z∈[-35,+115]mm · 8 of 88 slices shown (1 of 2)]
[im 1/88]
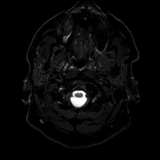
[im 13/88]
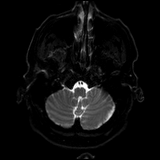
[im 25/88]
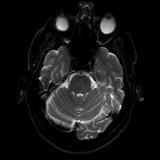
[im 38/88]
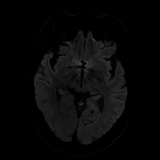
[im 50/88]
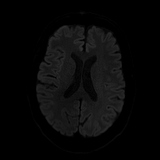
[im 63/88]
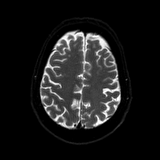
[im 75/88]
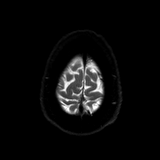
[im 88/88]
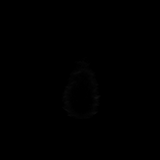

[Series 7: DWI · axial · 3.0mm · 1.44mm/px · z∈[-35,+115]mm · 4 of 44 slices shown (2 of 2)]
[im 1/44]
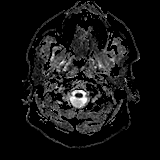
[im 15/44]
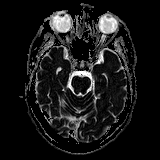
[im 29/44]
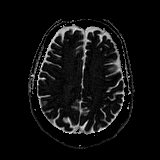
[im 44/44]
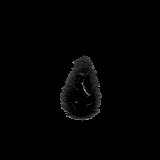

[Series 8: T2 · axial · 4.0mm · 0.36mm/px · z∈[-34,+107]mm · 2 of 28 slices shown]
[im 1/28]
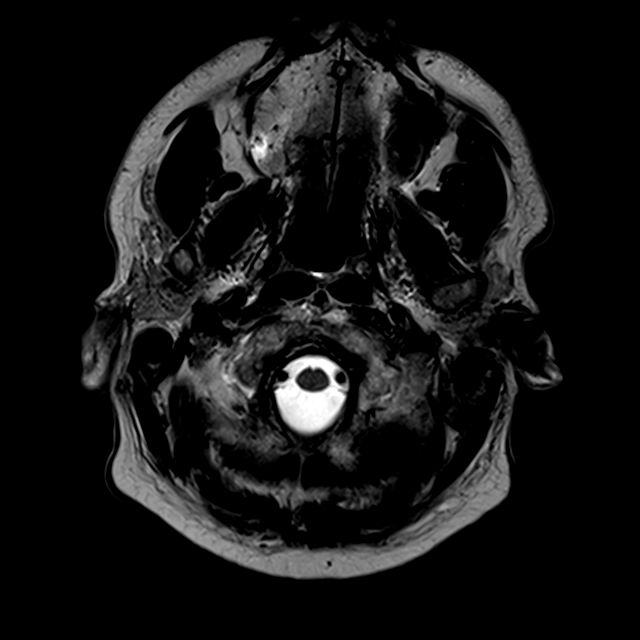
[im 28/28]
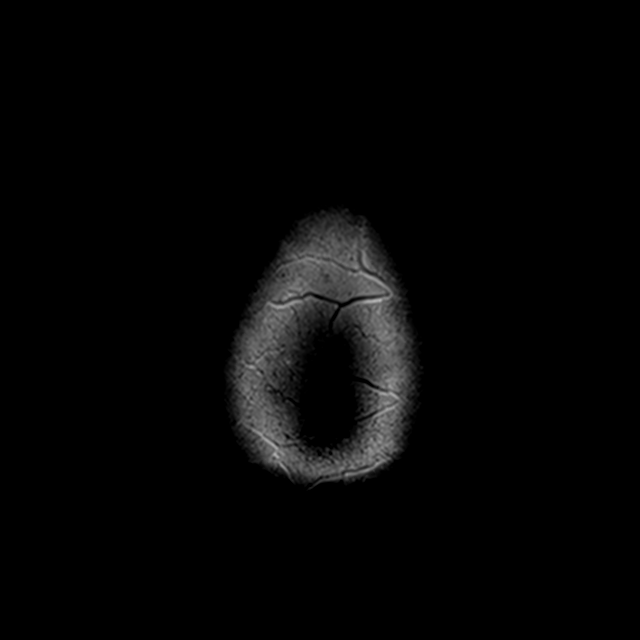

[Series 9: FLAIR · axial · 3.0mm · 0.72mm/px · z∈[-32,+105]mm · 2 of 28 slices shown]
[im 1/28]
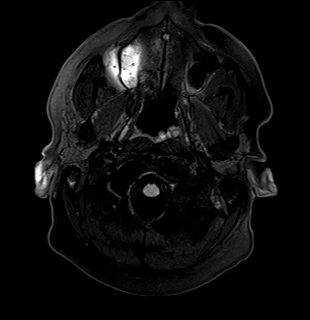
[im 28/28]
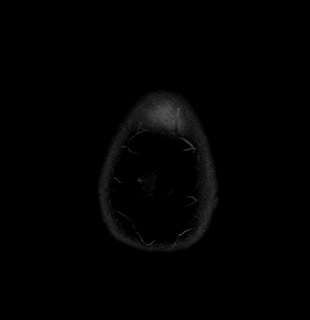

[Series 11: swi_images · axial · 2.0mm · 0.90mm/px · z∈[-38,+115]mm · 7 of 80 slices shown]
[im 1/80]
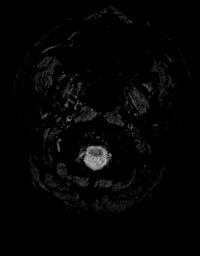
[im 14/80]
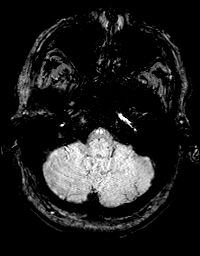
[im 27/80]
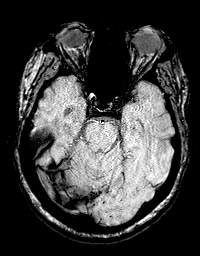
[im 40/80]
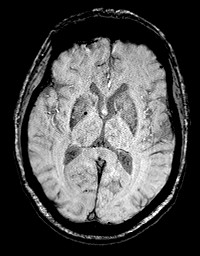
[im 53/80]
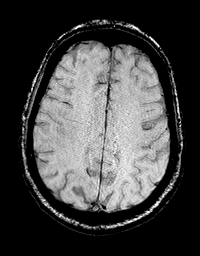
[im 66/80]
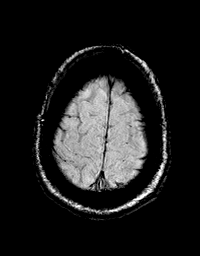
[im 80/80]
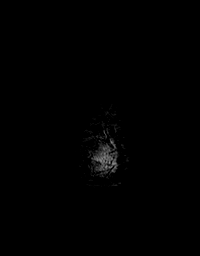

[Series 13: T1 · axial · 2.5mm · 0.50mm/px · 1 of 13 slices shown (2 of 3)]
[im 1/13]
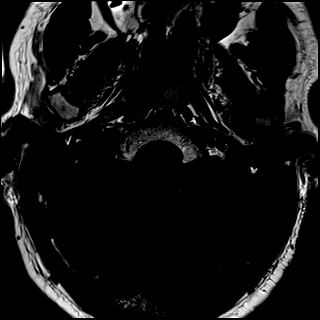

[Series 14: T1 · coronal · 2.5mm · 0.56mm/px · 1 of 13 slices shown (3 of 3)]
[im 1/13]
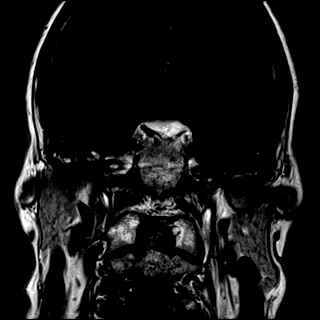

[Series 16: T1 post-contrast · coronal · 2.5mm · 0.56mm/px · 1 of 13 slices shown (1 of 3)]
[im 1/13]
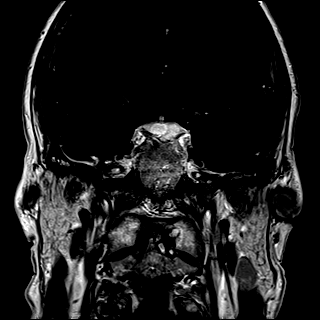

[Series 17: T1 post-contrast · axial · 2.5mm · 0.50mm/px · 1 of 13 slices shown (2 of 3)]
[im 1/13]
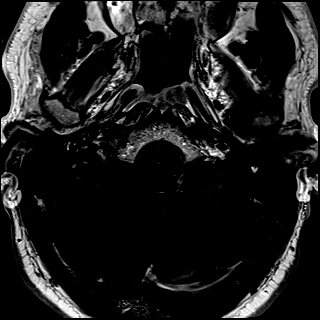

[Series 18: T1 post-contrast · axial · 1.0mm · 0.90mm/px · z∈[-39,+115]mm · 13 of 160 slices shown (3 of 3)]
[im 1/160]
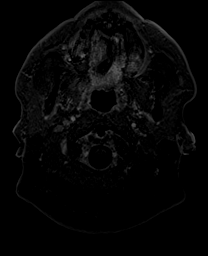
[im 14/160]
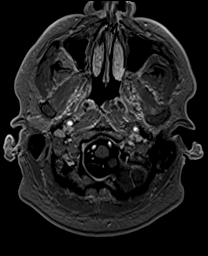
[im 27/160]
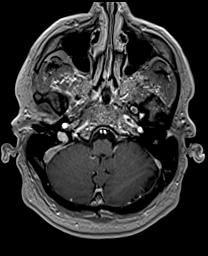
[im 40/160]
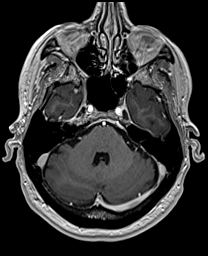
[im 54/160]
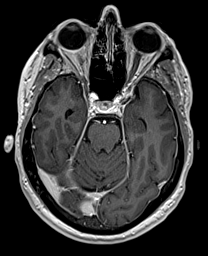
[im 67/160]
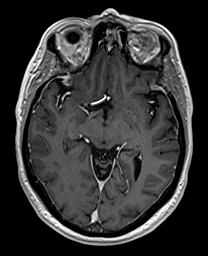
[im 80/160]
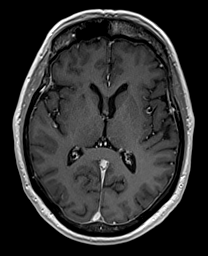
[im 93/160]
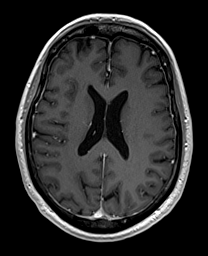
[im 107/160]
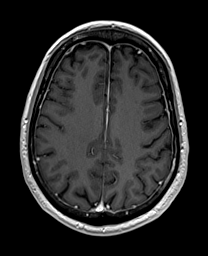
[im 120/160]
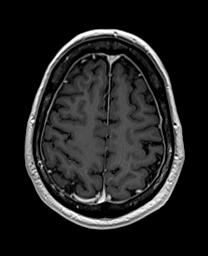
[im 133/160]
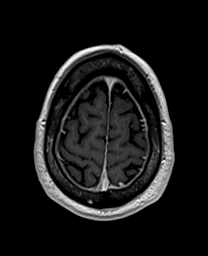
[im 146/160]
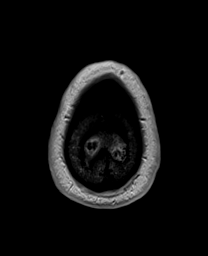
[im 160/160]
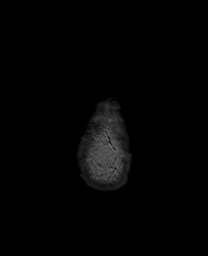

[43 of 48 positions shown; findings below may reference images not displayed]

FINDINGS: Brain:

No cerebellopontine angle mass or internal auditory canal lesion is
demonstrated. Unremarkable appearance of the seventh and eighth
cranial nerves bilaterally.

There is no evidence of acute infarct. No evidence of intracranial
mass. No midline shift or extra-axial fluid collection. No chronic
intracranial blood products. No abnormal intracranial enhancement.

Mild scattered T2/FLAIR hyperintensity within the cerebral white
matter and pons is nonspecific, but consistent with chronic small
vessel ischemic disease. Small chronic lacunar infarct within the
right basal ganglia. Cerebral volume is normal for age.

Vascular: Flow voids maintained within the proximal large arterial
vessels. Expected enhancement within the proximal large arterial
vessels and dural venous sinuses.

Skull and upper cervical spine: No focal marrow lesion. Incompletely
assessed upper cervical spondylosis.

Sinuses/Orbits: Visualized orbits demonstrate no acute abnormality.
Minimal ethmoid sinus mucosal thickening. Trace fluid within left
mastoid air cells.

Other: 14 mm mucous retention cyst within the left nasopharynx.
IMPRESSION: No cerebellopontine angle mass or internal auditory canal lesion is
demonstrated.

No evidence of acute intracranial abnormality.

Mild chronic small vessel ischemic disease. Chronic right basal
ganglia lacunar infarct.

14 mm mucous retention cyst within the left nasopharynx.

## 2021-04-24 ENCOUNTER — Other Ambulatory Visit (HOSPITAL_COMMUNITY): Payer: Self-pay | Admitting: Otolaryngology

## 2021-04-24 ENCOUNTER — Other Ambulatory Visit: Payer: Self-pay | Admitting: Otolaryngology

## 2021-04-24 DIAGNOSIS — H8102 Meniere's disease, left ear: Secondary | ICD-10-CM

## 2021-04-26 ENCOUNTER — Other Ambulatory Visit: Payer: Self-pay | Admitting: Otolaryngology

## 2021-05-01 ENCOUNTER — Ambulatory Visit (HOSPITAL_COMMUNITY)
Admission: RE | Admit: 2021-05-01 | Discharge: 2021-05-01 | Disposition: A | Discharge status: Home / Self Care | Payer: Medicare Other | Source: Ambulatory Visit | Attending: Otolaryngology | Admitting: Otolaryngology

## 2021-05-01 DIAGNOSIS — H8102 Meniere's disease, left ear: Secondary | ICD-10-CM | POA: Diagnosis present

## 2021-05-01 MED ORDER — IOHEXOL 300 MG/ML  SOLN: 100.0000 mL | Freq: Once | INTRAMUSCULAR | Status: DC | PRN

## 2021-06-14 ENCOUNTER — Encounter (HOSPITAL_BASED_OUTPATIENT_CLINIC_OR_DEPARTMENT_OTHER): Payer: Self-pay | Admitting: Otolaryngology

## 2021-06-14 ENCOUNTER — Other Ambulatory Visit: Payer: Self-pay

## 2021-06-17 ENCOUNTER — Encounter (HOSPITAL_BASED_OUTPATIENT_CLINIC_OR_DEPARTMENT_OTHER)
Admission: RE | Admit: 2021-06-17 | Discharge: 2021-06-17 | Disposition: A | Discharge status: Home / Self Care | Payer: Medicare Other | Source: Ambulatory Visit | Attending: Otolaryngology | Admitting: Otolaryngology

## 2021-06-17 DIAGNOSIS — I10 Essential (primary) hypertension: Secondary | ICD-10-CM | POA: Diagnosis not present

## 2021-06-17 DIAGNOSIS — Z01818 Encounter for other preprocedural examination: Secondary | ICD-10-CM | POA: Insufficient documentation

## 2021-06-17 LAB — BASIC METABOLIC PANEL
Anion gap: 9 (ref 5–15)
BUN: 37 mg/dL — ABNORMAL HIGH (ref 8–23)
CO2: 29 mmol/L (ref 22–32)
Calcium: 9.7 mg/dL (ref 8.9–10.3)
Chloride: 104 mmol/L (ref 98–111)
Creatinine, Ser: 1.68 mg/dL — ABNORMAL HIGH (ref 0.61–1.24)
GFR, Estimated: 44 mL/min — ABNORMAL LOW (ref >60)
Glucose, Bld: 94 mg/dL (ref 70–99)
Potassium: 4.3 mmol/L (ref 3.5–5.1)
Sodium: 142 mmol/L (ref 135–145)

## 2021-06-24 ENCOUNTER — Ambulatory Visit (HOSPITAL_BASED_OUTPATIENT_CLINIC_OR_DEPARTMENT_OTHER): Payer: Medicare Other | Admitting: Certified Registered"

## 2021-06-24 ENCOUNTER — Encounter (HOSPITAL_BASED_OUTPATIENT_CLINIC_OR_DEPARTMENT_OTHER): Payer: Self-pay | Admitting: Otolaryngology

## 2021-06-24 ENCOUNTER — Ambulatory Visit (HOSPITAL_BASED_OUTPATIENT_CLINIC_OR_DEPARTMENT_OTHER)
Admission: RE | Admit: 2021-06-24 | Discharge: 2021-06-24 | Disposition: A | Discharge status: Home / Self Care | Payer: Medicare Other | Attending: Otolaryngology | Admitting: Otolaryngology

## 2021-06-24 ENCOUNTER — Other Ambulatory Visit: Payer: Self-pay

## 2021-06-24 ENCOUNTER — Encounter (HOSPITAL_BASED_OUTPATIENT_CLINIC_OR_DEPARTMENT_OTHER)
Admission: RE | Disposition: A | Discharge status: Home / Self Care | Payer: Self-pay | Source: Home / Self Care | Attending: Otolaryngology

## 2021-06-24 DIAGNOSIS — I110 Hypertensive heart disease with heart failure: Secondary | ICD-10-CM

## 2021-06-24 DIAGNOSIS — I5030 Unspecified diastolic (congestive) heart failure: Secondary | ICD-10-CM | POA: Insufficient documentation

## 2021-06-24 DIAGNOSIS — J310 Chronic rhinitis: Secondary | ICD-10-CM | POA: Insufficient documentation

## 2021-06-24 DIAGNOSIS — H9042 Sensorineural hearing loss, unilateral, left ear, with unrestricted hearing on the contralateral side: Secondary | ICD-10-CM | POA: Insufficient documentation

## 2021-06-24 DIAGNOSIS — H8102 Meniere's disease, left ear: Secondary | ICD-10-CM | POA: Insufficient documentation

## 2021-06-24 DIAGNOSIS — I2510 Atherosclerotic heart disease of native coronary artery without angina pectoris: Secondary | ICD-10-CM

## 2021-06-24 DIAGNOSIS — R42 Dizziness and giddiness: Secondary | ICD-10-CM

## 2021-06-24 DIAGNOSIS — R0982 Postnasal drip: Secondary | ICD-10-CM | POA: Diagnosis not present

## 2021-06-24 DIAGNOSIS — J343 Hypertrophy of nasal turbinates: Secondary | ICD-10-CM | POA: Insufficient documentation

## 2021-06-24 DIAGNOSIS — I10 Essential (primary) hypertension: Secondary | ICD-10-CM

## 2021-06-24 DIAGNOSIS — Z79899 Other long term (current) drug therapy: Secondary | ICD-10-CM | POA: Insufficient documentation

## 2021-06-24 DIAGNOSIS — I080 Rheumatic disorders of both mitral and aortic valves: Secondary | ICD-10-CM | POA: Diagnosis not present

## 2021-06-24 DIAGNOSIS — F17210 Nicotine dependence, cigarettes, uncomplicated: Secondary | ICD-10-CM

## 2021-06-24 HISTORY — PX: ENDOLYMPHATIC SAC DECOMPRESSION: SHX6529

## 2021-06-24 HISTORY — DX: Heart failure, unspecified: I50.9

## 2021-06-24 HISTORY — DX: Essential (primary) hypertension: I10

## 2021-06-24 SURGERY — DECOMPRESSION, ENDOLYMPHATIC SAC
Anesthesia: General | Site: Ear | Laterality: Left

## 2021-06-24 MED ORDER — SUGAMMADEX SODIUM 200 MG/2ML IV SOLN
INTRAVENOUS | Status: DC | PRN
  Administered 2021-06-24: 500 mg via INTRAVENOUS

## 2021-06-24 MED ORDER — ONDANSETRON HCL 4 MG/2ML IJ SOLN
INTRAMUSCULAR | Status: AC
Start: 2021-06-24 — End: ?
  Filled 2021-06-24: qty 2

## 2021-06-24 MED ORDER — PHENYLEPHRINE HCL (PRESSORS) 10 MG/ML IV SOLN
INTRAVENOUS | Status: DC | PRN
  Administered 2021-06-24: 160 ug via INTRAVENOUS
  Administered 2021-06-24 (×7): 80 ug via INTRAVENOUS

## 2021-06-24 MED ORDER — ONDANSETRON HCL 4 MG/2ML IJ SOLN: 4.0000 mg | Freq: Once | INTRAMUSCULAR | Status: DC | PRN

## 2021-06-24 MED ORDER — SUGAMMADEX SODIUM 500 MG/5ML IV SOLN
INTRAVENOUS | Status: AC
  Filled 2021-06-24: qty 5

## 2021-06-24 MED ORDER — PROPOFOL 10 MG/ML IV BOLUS
INTRAVENOUS | Status: DC | PRN
  Administered 2021-06-24 (×2): 50 mg via INTRAVENOUS
  Administered 2021-06-24: 150 mg via INTRAVENOUS

## 2021-06-24 MED ORDER — ROCURONIUM BROMIDE 10 MG/ML (PF) SYRINGE
PREFILLED_SYRINGE | INTRAVENOUS | Status: AC
  Filled 2021-06-24: qty 10

## 2021-06-24 MED ORDER — LACTATED RINGERS IV SOLN: INTRAVENOUS | Status: DC

## 2021-06-24 MED ORDER — LIDOCAINE-EPINEPHRINE 1 %-1:100000 IJ SOLN
INTRAMUSCULAR | Status: DC | PRN
  Administered 2021-06-24: 3 mL

## 2021-06-24 MED ORDER — ONDANSETRON HCL 4 MG/2ML IJ SOLN
INTRAMUSCULAR | Status: DC | PRN
  Administered 2021-06-24: 4 mg via INTRAVENOUS

## 2021-06-24 MED ORDER — SODIUM CHLORIDE 0.9 % IV SOLN
INTRAVENOUS | Status: AC | PRN
  Administered 2021-06-24: 500 mL

## 2021-06-24 MED ORDER — FENTANYL CITRATE (PF) 100 MCG/2ML IJ SOLN
25.0000 ug | INTRAMUSCULAR | Status: DC | PRN
  Administered 2021-06-24 (×2): 25 ug via INTRAVENOUS

## 2021-06-24 MED ORDER — DEXAMETHASONE SODIUM PHOSPHATE 10 MG/ML IJ SOLN
INTRAMUSCULAR | Status: AC
Start: 2021-06-24 — End: ?
  Filled 2021-06-24: qty 1

## 2021-06-24 MED ORDER — AMOXICILLIN 875 MG PO TABS: 875.0000 mg | ORAL_TABLET | Freq: Two times a day (BID) | ORAL | 0 refills | Status: AC

## 2021-06-24 MED ORDER — PROPOFOL 10 MG/ML IV BOLUS
INTRAVENOUS | Status: AC
  Filled 2021-06-24: qty 20

## 2021-06-24 MED ORDER — ALBUTEROL SULFATE HFA 108 (90 BASE) MCG/ACT IN AERS
INHALATION_SPRAY | RESPIRATORY_TRACT | Status: DC | PRN
  Administered 2021-06-24: 8 via RESPIRATORY_TRACT

## 2021-06-24 MED ORDER — PHENYLEPHRINE HCL (PRESSORS) 10 MG/ML IV SOLN
INTRAVENOUS | Status: AC
  Filled 2021-06-24: qty 1

## 2021-06-24 MED ORDER — AMISULPRIDE (ANTIEMETIC) 5 MG/2ML IV SOLN: 10.0000 mg | Freq: Once | INTRAVENOUS | Status: DC | PRN

## 2021-06-24 MED ORDER — LIDOCAINE 2% (20 MG/ML) 5 ML SYRINGE
INTRAMUSCULAR | Status: DC | PRN
  Administered 2021-06-24: 80 mg via INTRAVENOUS

## 2021-06-24 MED ORDER — MIDAZOLAM HCL 5 MG/5ML IJ SOLN
INTRAMUSCULAR | Status: DC | PRN
  Administered 2021-06-24: 2 mg via INTRAVENOUS

## 2021-06-24 MED ORDER — OXYCODONE HCL 5 MG/5ML PO SOLN: 5.0000 mg | Freq: Once | ORAL | Status: AC | PRN

## 2021-06-24 MED ORDER — FENTANYL CITRATE (PF) 100 MCG/2ML IJ SOLN
INTRAMUSCULAR | Status: AC
  Filled 2021-06-24: qty 2

## 2021-06-24 MED ORDER — EPHEDRINE 5 MG/ML INJ
INTRAVENOUS | Status: AC
Start: 2021-06-24 — End: ?
  Filled 2021-06-24: qty 15

## 2021-06-24 MED ORDER — EPHEDRINE SULFATE (PRESSORS) 50 MG/ML IJ SOLN
INTRAMUSCULAR | Status: DC | PRN
  Administered 2021-06-24: 5 mg via INTRAVENOUS

## 2021-06-24 MED ORDER — FENTANYL CITRATE (PF) 100 MCG/2ML IJ SOLN
INTRAMUSCULAR | Status: DC | PRN
  Administered 2021-06-24 (×3): 50 ug via INTRAVENOUS

## 2021-06-24 MED ORDER — PHENYLEPHRINE HCL-NACL 20-0.9 MG/250ML-% IV SOLN
INTRAVENOUS | Status: DC | PRN
  Administered 2021-06-24: 25 ug/min via INTRAVENOUS

## 2021-06-24 MED ORDER — LIDOCAINE-EPINEPHRINE 1 %-1:100000 IJ SOLN
INTRAMUSCULAR | Status: AC
  Filled 2021-06-24: qty 1

## 2021-06-24 MED ORDER — CEFAZOLIN SODIUM 1 G IJ SOLR
INTRAMUSCULAR | Status: AC
  Filled 2021-06-24: qty 20

## 2021-06-24 MED ORDER — CEFAZOLIN SODIUM-DEXTROSE 2-3 GM-%(50ML) IV SOLR
INTRAVENOUS | Status: DC | PRN
  Administered 2021-06-24: 2 g via INTRAVENOUS

## 2021-06-24 MED ORDER — OXYCODONE-ACETAMINOPHEN 5-325 MG PO TABS: 1.0000 | ORAL_TABLET | ORAL | 0 refills | Status: AC | PRN

## 2021-06-24 MED ORDER — DEXAMETHASONE SODIUM PHOSPHATE 10 MG/ML IJ SOLN
INTRAMUSCULAR | Status: DC | PRN
  Administered 2021-06-24: 10 mg via INTRAVENOUS

## 2021-06-24 MED ORDER — ROCURONIUM BROMIDE 100 MG/10ML IV SOLN
INTRAVENOUS | Status: DC | PRN
  Administered 2021-06-24: 90 mg via INTRAVENOUS

## 2021-06-24 MED ORDER — EPINEPHRINE PF 1 MG/ML IJ SOLN
INTRAMUSCULAR | Status: AC
  Filled 2021-06-24: qty 2

## 2021-06-24 MED ORDER — ALBUTEROL SULFATE HFA 108 (90 BASE) MCG/ACT IN AERS
INHALATION_SPRAY | RESPIRATORY_TRACT | Status: AC
  Filled 2021-06-24: qty 6.7

## 2021-06-24 MED ORDER — OXYCODONE HCL 5 MG PO TABS
ORAL_TABLET | ORAL | Status: AC
  Filled 2021-06-24: qty 1

## 2021-06-24 MED ORDER — OXYCODONE HCL 5 MG PO TABS
5.0000 mg | ORAL_TABLET | Freq: Once | ORAL | Status: AC | PRN
  Administered 2021-06-24: 5 mg via ORAL

## 2021-06-24 MED ORDER — CIPROFLOXACIN-DEXAMETHASONE 0.3-0.1 % OT SUSP
OTIC | Status: DC | PRN
  Administered 2021-06-24: 4 [drp] via OTIC

## 2021-06-24 MED ORDER — MIDAZOLAM HCL 2 MG/2ML IJ SOLN
INTRAMUSCULAR | Status: AC
  Filled 2021-06-24: qty 2

## 2021-06-24 MED ORDER — 0.9 % SODIUM CHLORIDE (POUR BTL) OPTIME
TOPICAL | Status: DC | PRN
  Administered 2021-06-24: 200 mL

## 2021-06-24 MED ORDER — PHENYLEPHRINE 80 MCG/ML (10ML) SYRINGE FOR IV PUSH (FOR BLOOD PRESSURE SUPPORT)
PREFILLED_SYRINGE | INTRAVENOUS | Status: AC
  Filled 2021-06-24: qty 10

## 2021-06-24 MED ORDER — CIPROFLOXACIN-DEXAMETHASONE 0.3-0.1 % OT SUSP
OTIC | Status: AC
  Filled 2021-06-24: qty 7.5

## 2021-06-24 MED ORDER — LIDOCAINE 2% (20 MG/ML) 5 ML SYRINGE
INTRAMUSCULAR | Status: AC
  Filled 2021-06-24: qty 5

## 2021-06-24 SURGICAL SUPPLY — 59 items
ADH SKN CLS APL DERMABOND .7 (GAUZE/BANDAGES/DRESSINGS) ×1
BALL CTTN LRG ABS STRL LF (GAUZE/BANDAGES/DRESSINGS) ×1
BLADE CLIPPER SURG (BLADE) ×1 IMPLANT
BLADE EYE SICKLE 84 5 BEAV (BLADE) ×1 IMPLANT
BUR DIAMOND COARSE 3.0 (BURR) ×1 IMPLANT
BUR RND OSTEON ELITE 6.0 (BURR) ×1 IMPLANT
CANISTER SUCT 1200ML W/VALVE (MISCELLANEOUS) ×2 IMPLANT
CORD BIPOLAR FORCEPS 12FT (ELECTRODE) ×2 IMPLANT
COTTONBALL LRG STERILE PKG (GAUZE/BANDAGES/DRESSINGS) ×2 IMPLANT
DERMABOND ADVANCED (GAUZE/BANDAGES/DRESSINGS) ×1
DERMABOND ADVANCED .7 DNX12 (GAUZE/BANDAGES/DRESSINGS) ×1 IMPLANT
DRAPE MICROSCOPE WILD 40.5X102 (DRAPES) ×2 IMPLANT
DRAPE SURG 17X23 STRL (DRAPES) ×1 IMPLANT
DRAPE SURG IRRIG POUCH 19X23 (DRAPES) ×2 IMPLANT
DRSG GLASSCOCK MASTOID ADT (GAUZE/BANDAGES/DRESSINGS) ×1 IMPLANT
ELECT COATED BLADE 2.86 ST (ELECTRODE) ×2 IMPLANT
ELECT PAIRED SUBDERMAL (MISCELLANEOUS) ×2
ELECT REM PT RETURN 9FT ADLT (ELECTROSURGICAL) ×2
ELECTRODE PAIRED SUBDERMAL (MISCELLANEOUS) ×1 IMPLANT
ELECTRODE REM PT RTRN 9FT ADLT (ELECTROSURGICAL) ×1 IMPLANT
FORCEPS BIPOLAR SPETZLER 8 1.0 (NEUROSURGERY SUPPLIES) ×2 IMPLANT
GAUZE 4X4 16PLY ~~LOC~~+RFID DBL (SPONGE) IMPLANT
GAUZE SPONGE 4X4 12PLY STRL (GAUZE/BANDAGES/DRESSINGS) IMPLANT
GAUZE SPONGE 4X4 12PLY STRL LF (GAUZE/BANDAGES/DRESSINGS) IMPLANT
GLOVE BIO SURGEON STRL SZ7.5 (GLOVE) ×4 IMPLANT
GLOVE BIOGEL PI IND STRL 7.0 (GLOVE) IMPLANT
GLOVE BIOGEL PI INDICATOR 7.0 (GLOVE) ×4
GOWN STRL REUS W/ TWL LRG LVL3 (GOWN DISPOSABLE) ×2 IMPLANT
GOWN STRL REUS W/TWL LRG LVL3 (GOWN DISPOSABLE) ×4
HEMOSTAT SURGICEL .5X2 ABSORB (HEMOSTASIS) IMPLANT
HEMOSTAT SURGICEL 2X14 (HEMOSTASIS) IMPLANT
IV NS 500ML (IV SOLUTION) ×2
IV NS 500ML BAXH (IV SOLUTION) ×1 IMPLANT
IV SET EXT 30 76VOL 4 MALE LL (IV SETS) ×2 IMPLANT
NDL HYPO 25X1 1.5 SAFETY (NEEDLE) ×1 IMPLANT
NDL SAFETY ECLIPSE 18X1.5 (NEEDLE) ×1 IMPLANT
NEEDLE HYPO 18GX1.5 SHARP (NEEDLE) ×2
NEEDLE HYPO 25X1 1.5 SAFETY (NEEDLE) ×2 IMPLANT
NS IRRIG 1000ML POUR BTL (IV SOLUTION) ×2 IMPLANT
PACK BASIN DAY SURGERY FS (CUSTOM PROCEDURE TRAY) ×2 IMPLANT
PACK ENT DAY SURGERY (CUSTOM PROCEDURE TRAY) ×2 IMPLANT
PAD ALCOHOL SWAB (MISCELLANEOUS) ×4 IMPLANT
PENCIL SMOKE EVACUATOR (MISCELLANEOUS) ×2 IMPLANT
PROBE NERVBE PRASS .33 (MISCELLANEOUS) IMPLANT
SLEEVE IRRIGATION ELITE 7 (MISCELLANEOUS) IMPLANT
SLEEVE SCD COMPRESS KNEE MED (STOCKING) ×2 IMPLANT
SPIKE FLUID TRANSFER (MISCELLANEOUS) ×2 IMPLANT
SPONGE SURGIFOAM ABS GEL 12-7 (HEMOSTASIS) ×1 IMPLANT
SUT BONE WAX W31G (SUTURE) ×1 IMPLANT
SUT SILK 3 0 TIES 17X18 (SUTURE)
SUT SILK 3-0 18XBRD TIE BLK (SUTURE) IMPLANT
SUT SILK 4 0 TIES 17X18 (SUTURE) IMPLANT
SUT VIC AB 3-0 FS2 27 (SUTURE) IMPLANT
SUT VICRYL 4-0 PS2 18IN ABS (SUTURE) ×2 IMPLANT
SYR 3ML 18GX1 1/2 (SYRINGE) ×1 IMPLANT
SYR BULB EAR ULCER 3OZ GRN STR (SYRINGE) ×2 IMPLANT
TOWEL GREEN STERILE FF (TOWEL DISPOSABLE) ×2 IMPLANT
TRAY DSU PREP LF (CUSTOM PROCEDURE TRAY) ×2 IMPLANT
TUBING IRRIGATION (MISCELLANEOUS) ×2 IMPLANT

## 2021-06-24 NOTE — Anesthesia Postprocedure Evaluation (Signed)
Anesthesia Post Note  Patient: Daniel Pierce  Procedure(s) Performed: ENDOLYMPHATIC SAC DECOMPRESSION (Left: Ear)     Patient location during evaluation: PACU Anesthesia Type: General Level of consciousness: awake Pain management: pain level controlled Vital Signs Assessment: post-procedure vital signs reviewed and stable Respiratory status: spontaneous breathing and respiratory function stable Cardiovascular status: stable Postop Assessment: no apparent nausea or vomiting Anesthetic complications: no   No notable events documented.  Last Vitals:  Vitals:   06/24/21 1245 06/24/21 1315  BP: 110/68 125/78  Pulse: 80 80  Resp: 12   Temp:  36.8 C  SpO2: 92% 94%    Last Pain:  Vitals:   06/24/21 1309  TempSrc:   PainSc: 6                  Candra R Andersen Mckiver

## 2021-06-24 NOTE — Op Note (Signed)
DATE OF PROCEDURE: 06/24/2021  SURGEON:  Leta Baptist, MD  OPERATIVE REPORT   PREOPERATIVE DIAGNOSIS:   1.  Left ear Mnire's disease 2.  Chronic dizziness   POSTOPERATIVE DIAGNOSIS:   1.  Left ear Mnire's disease 2.  Chronic dizziness   PROCEDURES PERFORMED: 1. Left endolymphatic sac decompression   ANESTHESIA:  General endotracheal tube anesthesia.   COMPLICATIONS:  None.   ESTIMATED BLOOD LOSS:  38m   INDICATION FOR PROCEDURE:    Daniel KEIMIGis a 70y.o. male with a history of recurrent dizziness and left ear hearing loss.  He was noted to have asymmetric left ear low-frequency sensorineural hearing loss.  He was previously diagnosed with left ear Mnire's disease.  His VNG showed left peripheral vestibular weakness.  He was continued on his low-salt diet and Dyazide diuretic.  Despite medical treatment, he continued to be symptomatic.  Based on the above findings, the decision was made for the patient to undergo the above-stated procedure. The risks, benefits, alternatives, and details of the procedures were discussed with the patient.  Questions were invited and answered.  Informed consent was obtained.   DESCRIPTION OF PROCEDURE:  The patient was taken to the operating room and placed supine on the operating table.  General endotracheal tube anesthesia was induced by the anesthesiologist.  The patient was positioned and prepped and draped in a standard fashion for left ear surgery. Facial nerve monitoring electrodes were placed. The facial nerve monitoring system was functional throughout the case.   1% lidocaine with 1-100,000 epinephrine was infiltrated into the left postauricular crease. A standard postauricular incision was made. The soft tissue covering the mastoid cortex was carefully elevated. Using a #7 cutting bur, a standard cortical mastoidectomy procedure was performed. The tegmen and sigmoid sinus were identified and preserved. The posterior canal wall was taken down  to the level of the facial nerve. The antrum was entered.  The bony covering of the posterior cranial fossa was then identified.  The bony covering was then removed using a combination of #3 diamond burs and a house curette.  The sigmoid sinus was then Blue Lined and the bony covering removed.  The endolymphatic sac was then identified.  The bony covering of the endolymphatic sac was also removed.  The surgical site was copiously irrigated.  Ciprodex eardrops were applied.   The postauricular incision was then closed in layers with 4-0 Vicryl and Dermabond. A Glasscock dressing was applied. That concluded the procedure for the patient.  The care of the patient was turned over to the anesthesiologist.  The patient was awakened from anesthesia without difficulty.  He was extubated and transferred to the recovery room in good condition.   OPERATIVE FINDINGS: The left endolymphatic sac and sigmoid sinus were decompressed.   SPECIMEN: None   FOLLOWUP CARE:  The patient will be discharged home once he is awake and alert.  He will follow up in my office in 1 week.

## 2021-06-24 NOTE — Discharge Instructions (Addendum)
Post Anesthesia Home Care Instructions  Activity: Get plenty of rest for the remainder of the day. A responsible individual must stay with you for 24 hours following the procedure.  For the next 24 hours, DO NOT: -Drive a car -Paediatric nurse -Drink alcoholic beverages -Take any medication unless instructed by your physician -Make any legal decisions or sign important papers.  Meals: Start with liquid foods such as gelatin or soup. Progress to regular foods as tolerated. Avoid greasy, spicy, heavy foods. If nausea and/or vomiting occur, drink only clear liquids until the nausea and/or vomiting subsides. Call your physician if vomiting continues.  Special Instructions/Symptoms: Your throat may feel dry or sore from the anesthesia or the breathing tube placed in your throat during surgery. If this causes discomfort, gargle with warm salt water. The discomfort should disappear within 24 hours.  If you had a scopolamine patch placed behind your ear for the management of post- operative nausea and/or vomiting:  1. The medication in the patch is effective for 72 hours, after which it should be removed.  Wrap patch in a tissue and discard in the trash. Wash hands thoroughly with soap and water. 2. You may remove the patch earlier than 72 hours if you experience unpleasant side effects which may include dry mouth, dizziness or visual disturbances. 3. Avoid touching the patch. Wash your hands with soap and water after contact with the patch.     --------------  POSTOPERATIVE INSTRUCTIONS FOR PATIENTS HAVING MASTOIDECTOMY SURGERY  You may have nausea, vomiting, or a low grade fever for a few days after surgery. This is not unusual.  However, if the nausea and vomiting become severe or last more than one day, please call our office. Medication for nausea may be prescribed. You may take Tylenol every four hours for fever. If your fever should rise above 101 F, please contact our office. Limit  your activities for one week. This includes avoiding heavy lifting (over 20lbs), vigorous exercise, and contact sports. Do not blow your nose for approximately one week.  Any accumulation in the nose should be drawn back into the throat and expectorated through the mouth to avoid infecting the ear. If it is necessary to sneeze, do so with your mouth open to decrease pressure to your ears. Do not hold your nose to avoid sneezing.  You may wash your hair 2 days after the operation. Please protect the ear and any external incision from water. We recommend placing some plastic wrap over the ear and incision to help protect against water. It may be necessary to have someone help you during the first several washings.  Try to keep the incision clean and dry. You should clean crust from the incisional area with diluted hydrogen peroxide. Any time you are going to clean your ear, please wash your hands thoroughly prior to starting. Some dull postoperative ear pain is expected. Your physician may prescribe pain medicine to help relieve your discomfort. If your postoperative pain increases and your medication is not helping, please call the office before taking any other medication that we have not prescribed or recommended. If any of the following should occur, contact Dr.Basma Buchner:   (Office: [phone])) Persistent bleeding  Persistent fever Purulent drainage (pus) from the ear or incision Increasing redness around the suture line Persistent pain or dizziness Facial weakness Sometimes, with a larger incision behind the ear, the incision may open and drain. If it occurs, please contact our office. If your physician prescribes an antibiotic, fill the prescription promptly and take all of the medicine as directed until the entire supply is gone.  You may experience some popping and cracking sounds in the ear for up to several weeks. It may sound like you  are "talking in a barrel" or a tunnel. This is normal and should not cause concern. Because a nerve for taste passes through your ear, it is not unusual for your taste sensation to be altered for several weeks or months. You may experience some numbness in your outer ear, earlobe, and the incision area. This is normal, and most of the numbness will be expected to fade over a period of time.                                                               Your eardrum may look "pink" or "red" for up to a month postoperatively. The red coloration is due to fluid in the middle ear. The change in color should not be confused with infection.  It is important to return to our office for your postoperative appointment as scheduled. If for some reason you were not given a postoperative appointment, please call our office at [phone].

## 2021-06-24 NOTE — Transfer of Care (Signed)
Immediate Anesthesia Transfer of Care Note  Patient: Daniel Pierce  Procedure(s) Performed: ENDOLYMPHATIC SAC DECOMPRESSION (Left: Ear)  Patient Location: PACU  Anesthesia Type:General  Level of Consciousness: drowsy  Airway & Oxygen Therapy: Patient Spontanous Breathing and Patient connected to face mask oxygen  Post-op Assessment: Report given to RN and Post -op Vital signs reviewed and stable  Post vital signs: Reviewed and stable  Last Vitals:  BP: 119/60 (75) Vitals Value Taken Time  BP    Temp    Pulse 91 06/24/21 1137  Resp 21 06/24/21 1137  SpO2 98 % 06/24/21 1137  Vitals shown include unvalidated device data.  Last Pain:  Vitals:   06/24/21 0826  TempSrc: Oral  PainSc: 2       Patients Stated Pain Goal: 8 (17/47/15 9539)  Complications: No notable events documented.

## 2021-06-24 NOTE — Anesthesia Preprocedure Evaluation (Addendum)
Anesthesia Evaluation  Patient identified by MRN, date of birth, ID band Patient awake    Reviewed: Allergy & Precautions, NPO status , Patient's Chart, lab work & pertinent test results, reviewed documented beta blocker date and time   Airway Mallampati: III  TM Distance: >3 FB Neck ROM: Full    Dental no notable dental hx. (+) Teeth Intact, Dental Advisory Given Multiple crowns:   Pulmonary Current Smoker and Patient abstained from smoking.,  50 pack year history 1 ppd currently    Pulmonary exam normal breath sounds clear to auscultation       Cardiovascular hypertension, Pt. on medications and Pt. on home beta blockers + CAD (non-obstructive) and +CHF (grade 1 diastolic dysfunction)  Normal cardiovascular exam+ Valvular Problems/Murmurs (mild AS/AI/MR) AS, AI and MR  Rhythm:Regular Rate:Normal  Sinus bradycardia with sinus arrhythmia Otherwise normal ECG When compared with ECG of 11-Jun-2020 14:38, ventricular bigeminy is no longer present Confirmed by Skeet Latch [phone]) on 06/18/2021 5:17:04 AM  cardiologist Dr. Sondra Come for history of HTN, resolved nonischemic cardiomyopathy, mild aortic stenosis and aortic insufficiency, mild nonobstructive coronary disease, frequent PVCs, hyperlipidemia.  Per notes, history of mild nonobstructive coronary disease seen on cardiac catheterization performed in November 2015 with previously mildly reduced LV function (ejection fraction 45% before but 60% on TTE 12/2014). Last seen 10/05/2019.  Per note, he has good exercise tolerance with no symptoms of angina  Most recent echocardiogram was performed in December 2020 which revealed normal left ventricular size, wall thickness, wall motion, and function, EF 55 to 60%, mild grade 1 diastolic dysfunction, normal RV size and function, mild pulmonary hypertension with an estimated systolic PA pressure of 53-97 mmHg, and mild AS, AI, MR, TR, and  PI.  24-hour Holter monitor 01/04/2015 (copy in chart): To rotation: 1.  Predominantly sinus rhythm throughout with extremely frequent isolated PVCs including cycles of ventricular bigeminy or trigeminy and rare isolated SVE's (HR 54-120, average 81 bpm). 2.  Of note, 20.5% of all QRS complexes during the monitoring period were ventricular ectopy. 3.  Rare short 3-4 beat SVE episodes.  No pauses. 4.  No symptoms were reported during the monitoring period.   Neuro/Psych negative neurological ROS  negative psych ROS   GI/Hepatic negative GI ROS, Neg liver ROS, Controlled,  Endo/Other  negative endocrine ROS  Renal/GU Renal InsufficiencyCr 1.68, appears to be baseline  negative genitourinary   Musculoskeletal  (+) Arthritis , Osteoarthritis,    Abdominal   Peds negative pediatric ROS (+)  Hematology   Anesthesia Other Findings vertigo  Reproductive/Obstetrics negative OB ROS                           Anesthesia Physical Anesthesia Plan  ASA: 3  Anesthesia Plan: General   Post-op Pain Management:    Induction: Intravenous  PONV Risk Score and Plan: 1 and Midazolam, Treatment may vary due to age or medical condition, Ondansetron and Dexamethasone  Airway Management Planned: Oral ETT  Additional Equipment: None  Intra-op Plan:   Post-operative Plan: Extubation in OR  Informed Consent: I have reviewed the patients History and Physical, chart, labs and discussed the procedure including the risks, benefits and alternatives for the proposed anesthesia with the patient or authorized representative who has indicated his/her understanding and acceptance.     Dental advisory given  Plan Discussed with: CRNA, Anesthesiologist and Surgeon  Anesthesia Plan Comments:         Anesthesia Quick Evaluation

## 2021-06-24 NOTE — Anesthesia Procedure Notes (Signed)
Procedure Name: Intubation Date/Time: 06/24/2021 9:34 AM  Performed by: Lavonia Dana, CRNAPre-anesthesia Checklist: Patient identified, Emergency Drugs available, Suction available and Patient being monitored Patient Re-evaluated:Patient Re-evaluated prior to induction Oxygen Delivery Method: Circle system utilized Preoxygenation: Pre-oxygenation with 100% oxygen Induction Type: IV induction Ventilation: Mask ventilation without difficulty and Oral airway inserted - appropriate to patient size Laryngoscope Size: Mac and 4 Grade View: Grade II Tube type: Oral Tube size: 7.5 mm Number of attempts: 1 Airway Equipment and Method: Stylet and Oral airway Placement Confirmation: ETT inserted through vocal cords under direct vision, positive ETCO2 and breath sounds checked- equal and bilateral Secured at: 24 cm Tube secured with: Tape Dental Injury: Teeth and Oropharynx as per pre-operative assessment

## 2021-06-24 NOTE — H&P (Signed)
Cc: Recurrent dizziness, Mnire's disease, hearing loss  HPI: The patient is a 71 year old male who returns today with multiple medical issues.  The patient was last seen 3 months ago.  At that time, he was complaining of recurrent dizziness, left ear hearing loss, chronic nasal congestion, nasal drainage, and recurrent dizziness.  He was noted to have asymmetric left ear low-frequency sensorineural hearing loss.  He was previously diagnosed with left ear Mnire's disease.  His VNG showed left peripheral vestibular weakness.  He was continued on his low-salt diet and Dyazide diuretic.  He was also treated with Flonase and Atrovent nasal sprays, and daily Singulair.  The patient returns today reporting recurrent dizziness over the past 3 months.  He had several episodes of severe spinning vertigo.  His left ear hearing loss has fluctuated over time.  In addition, he continues to have chronic nasal congestion and postnasal drainage.  Currently he denies any otalgia, otorrhea, vertigo, facial pain, fever, or visual change. No other ENT, GI, or respiratory issue noted since the last visit.   Exam: General: Communicates without difficulty, well nourished, no acute distress. Head: Normocephalic, no evidence injury, no tenderness, facial buttresses intact without stepoff. Face/sinus: No tenderness to palpation and percussion. Facial movement is normal and symmetric. Eyes: PERRL, EOMI. No scleral icterus, conjunctivae clear. Neuro: CN II exam reveals vision grossly intact.  No nystagmus at any point of gaze. Ears: Auricles well formed without lesions.  Ear canals are intact without mass or lesion.  No erythema or edema is appreciated.  The TMs are intact without fluid. Nose: External evaluation reveals normal support and skin without lesions.  Dorsum is intact.  Anterior rhinoscopy reveals congested mucosa over anterior aspect of inferior turbinates and intact septum.  No purulence noted. Oral:  Oral cavity and  oropharynx are intact, symmetric, without erythema or edema.  Mucosa is moist without lesions. Neck: Full range of motion without pain.  There is no significant lymphadenopathy.  No masses palpable.  Thyroid bed within normal limits to palpation.  The left parotid incision is well-healed.  Trachea is midline. Neuro:  CN 2-12 grossly intact. Gait wide-based. Vestibular: No nystagmus at any point of gaze. Dix Hallpike negative. Vestibular: There is no nystagmus with pneumatic pressure on either tympanic membrane or Valsalva. The cerebellar examination is unremarkable.   AUDIOMETRIC TESTING: I have read and reviewed the audiometric test, which shows asymmetric left ear low-frequency sensorineural hearing loss.  The speech reception threshold is 10dB AD and 50dB AS. The discrimination score is 84% AD and 52% AS. The tympanogram is normal bilaterally.   Assessment  1.  Chronic rhinitis with nasal mucosal congestion, bilateral inferior turbinate hypertrophy, and serous postnasal drainage. 2.  No acute sinus infection is noted today. 3.  Left ear Mnire's disease, with recurrent exacerbations. 4.  Stable asymmetric left ear sensorineural hearing loss.  Plan  1.  The physical exam and nasal endoscopy findings are reviewed with the patient. 2.  Continue with Flonase nasal spray and Atrovent nasal spray. 3.  Singulair 10 mg daily. 4.  Continue with Dyazide diuretic and low-salt diet.  A refill for Dyazide is given to the patient. 5.  The pathophysiology of Mnire's disease is discussed with the patient.  In light of his recurrent dizziness, he may benefit from surgical intervention with left endolymphatic sac decompression surgery.  The risk, benefits, alternatives, and details of the procedure are extensively discussed.  Questions are invited and answered. 6.  The patient would like to  proceed with the endolymphatic sac decompression surgery.

## 2021-06-25 ENCOUNTER — Encounter (HOSPITAL_BASED_OUTPATIENT_CLINIC_OR_DEPARTMENT_OTHER): Payer: Self-pay | Admitting: Otolaryngology

## 2022-05-22 IMAGING — US US BIOPSY FNA W/ IMAGING
1 series · 13 of 16 positions shown · non-contrast
Comparison: Soft tissue neck ultrasound-04/09/2020

INDICATION: No known primary, now with palpable nodule left parotid gland.
Please perform ultrasound-guided biopsy for tissue diagnostic
purposes.

EXAM:
ULTRASOUND-GUIDED LEFT PAROTID NODULE BIOPSY
TECHNIQUE: Informed written consent was obtained from the patient after a
discussion of the risks, benefits and alternatives to treatment.
Questions regarding the procedure were encouraged and answered.
Initial ultrasound scanning demonstrated an approximately 3.0 x
cm hypoechoic nodule within the left parotid gland (image 2),
grossly unchanged compared to soft tissue neck ultrasound 4
05/09/2020. an ultrasound image was saved for documentation
purposes. The procedure was planned. A timeout was performed prior
to the initiation of the procedure.

[Series 1: us fna biopsy salivary gland parotid gland 1st les · 13 of 16 slices shown]
[im 1/16]
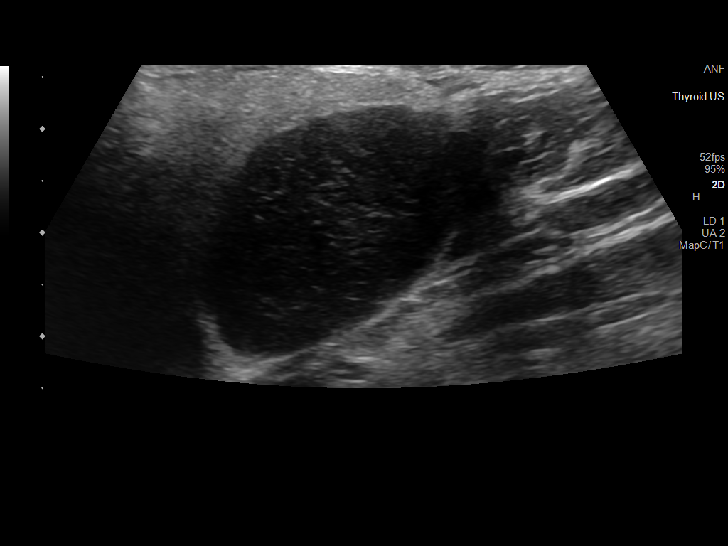
[im 2/16]
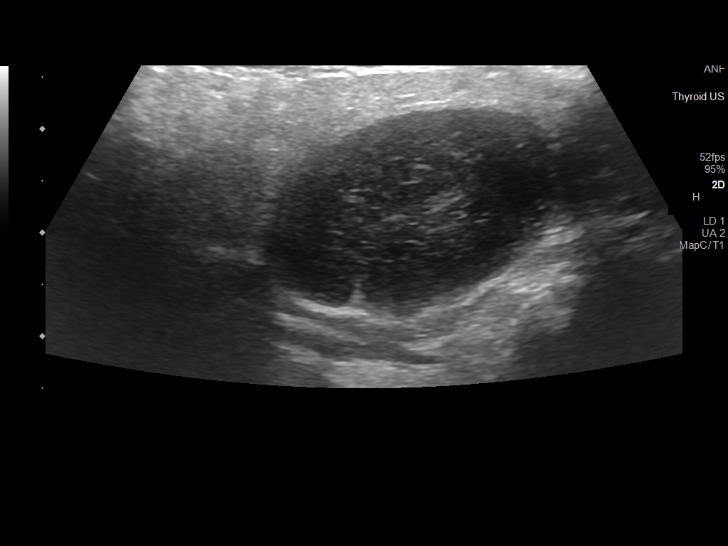
[im 4/16]
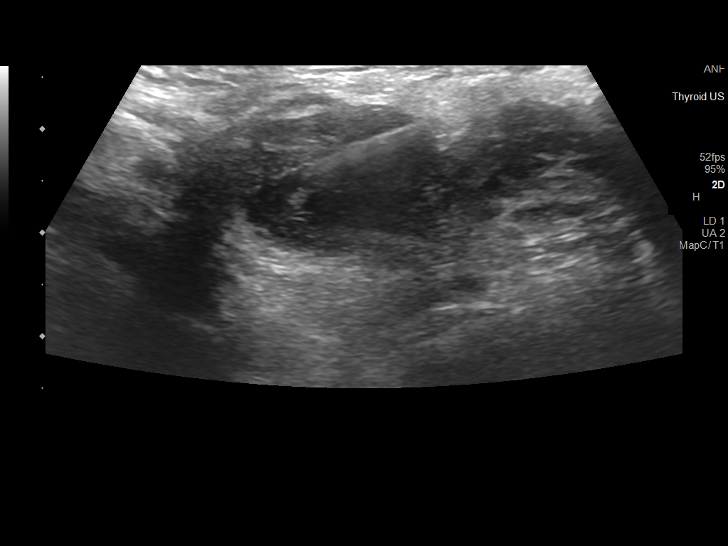
[im 5/16]
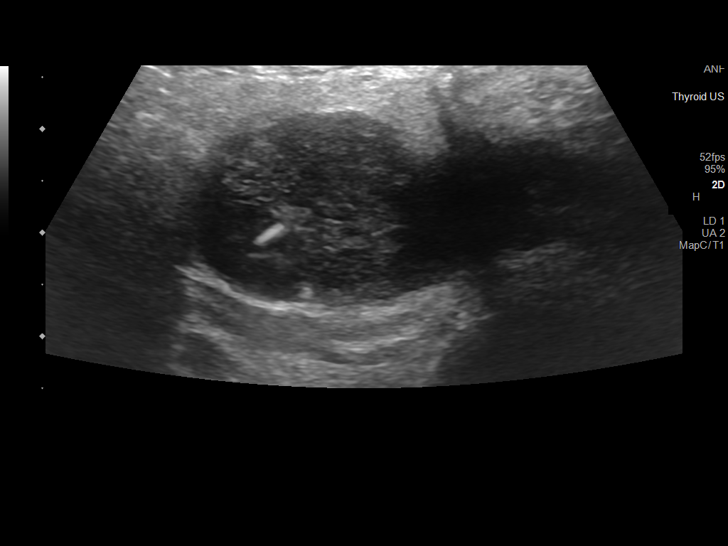
[im 6/16]
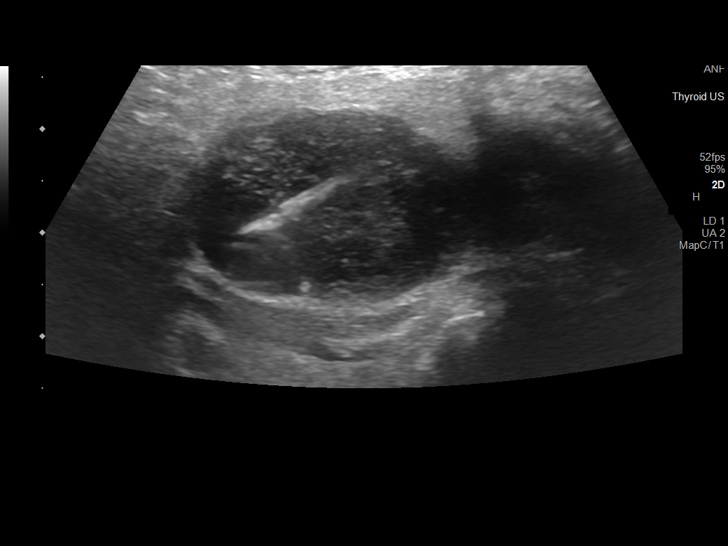
[im 7/16]
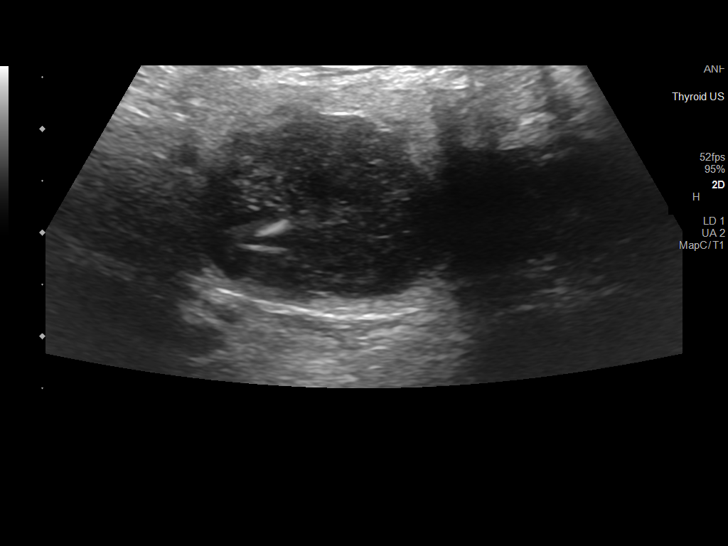
[im 9/16]
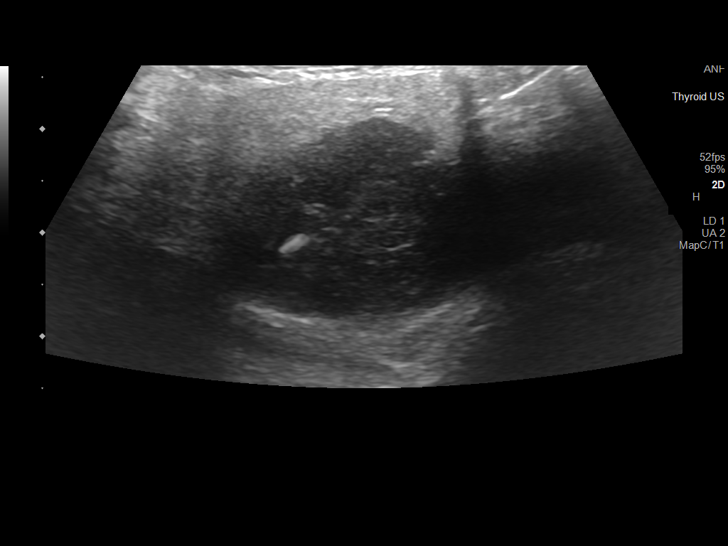
[im 10/16]
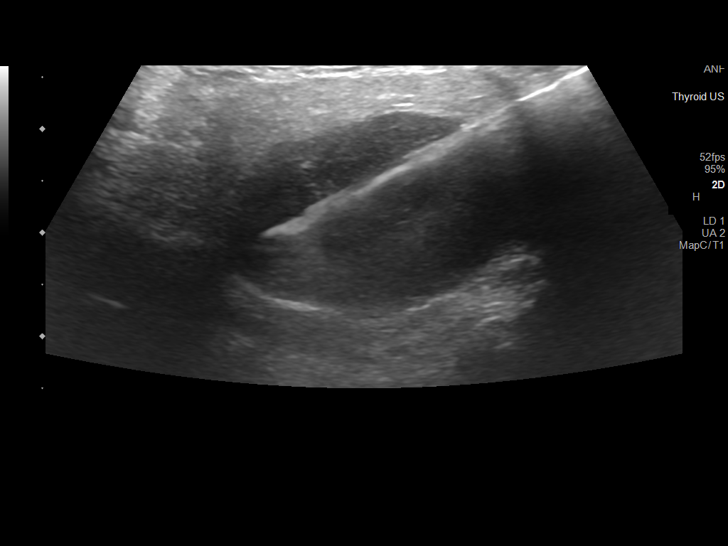
[im 11/16]
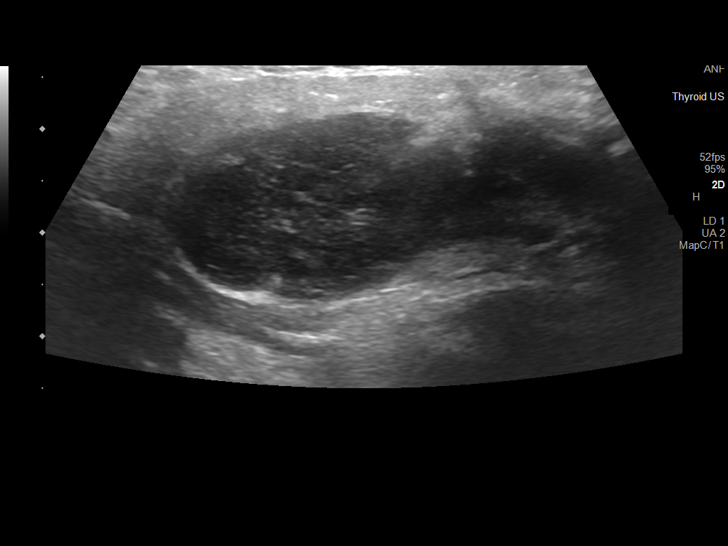
[im 12/16]
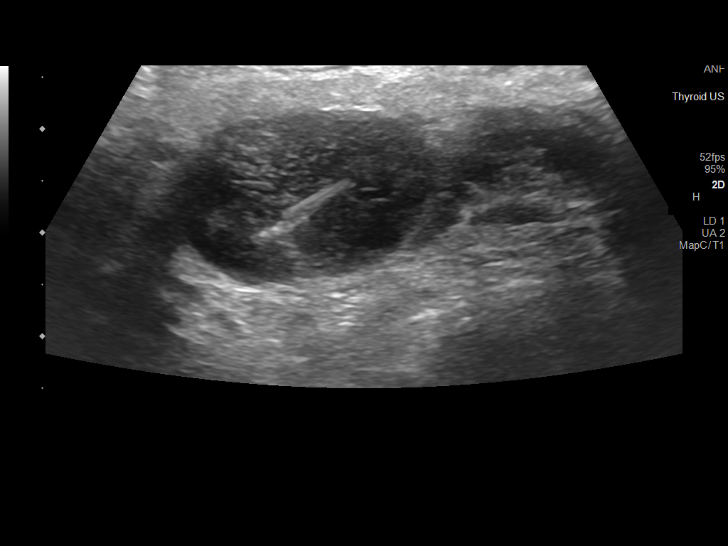
[im 13/16]
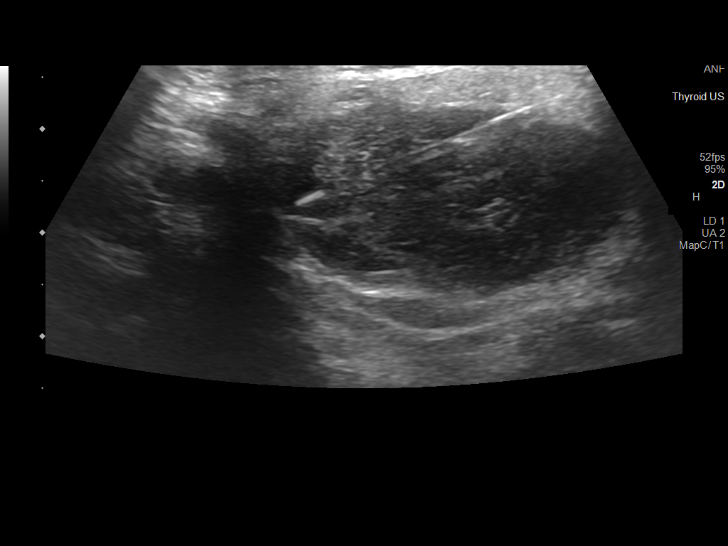
[im 15/16]
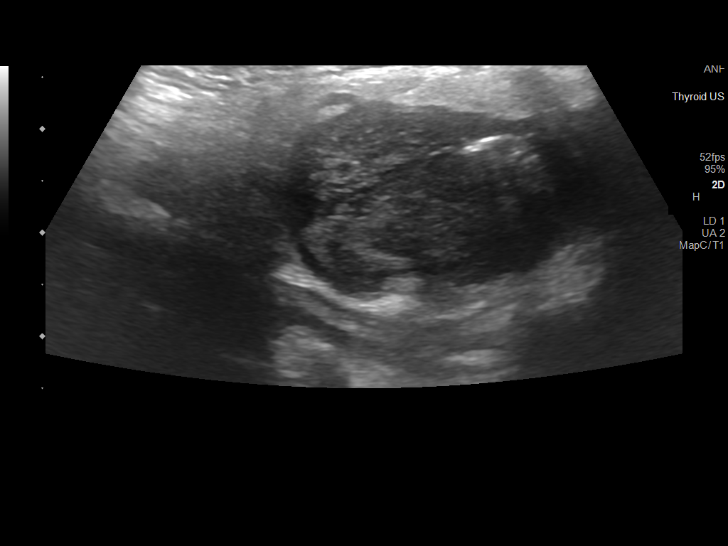
[im 16/16]
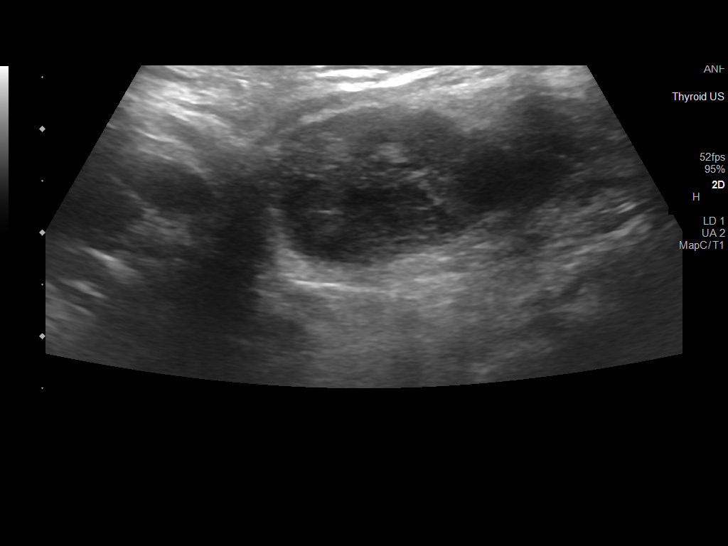

[13 of 16 positions shown; findings below may reference images not displayed]

MEDICATIONS:
None

ANESTHESIA/SEDATION:
None, per patient request

COMPLICATIONS:
None immediate.
The operative was prepped and draped in the usual sterile fashion,
and a sterile drape was applied covering the operative field. A
timeout was performed prior to the initiation of the procedure.
Local anesthesia was provided with 1% lidocaine with epinephrine.

Under direct ultrasound guidance, an 18 gauge core needle device was
utilized to obtain to obtain 6 core needle biopsies of the
indeterminate nodule within the left parotid gland.

The samples were placed in saline and submitted to pathology. The
needle was removed and hemostasis was achieved with manual
compression. Post procedure scan was negative for significant
hematoma. A dressing was placed. The patient tolerated the procedure
well without immediate postprocedural complication.
IMPRESSION: Technically successful ultrasound guided biopsy of indeterminate
hypoechoic nodule within the left parotid gland.

## 2022-08-14 ENCOUNTER — Ambulatory Visit: Payer: Medicare Other | Admitting: Podiatry

## 2022-08-14 DIAGNOSIS — M778 Other enthesopathies, not elsewhere classified: Secondary | ICD-10-CM

## 2022-08-26 ENCOUNTER — Ambulatory Visit (INDEPENDENT_AMBULATORY_CARE_PROVIDER_SITE_OTHER): Payer: Medicare Other

## 2022-08-26 ENCOUNTER — Ambulatory Visit (INDEPENDENT_AMBULATORY_CARE_PROVIDER_SITE_OTHER): Payer: Medicare Other | Admitting: Podiatry

## 2022-08-26 ENCOUNTER — Encounter: Payer: Self-pay | Admitting: Podiatry

## 2022-08-26 ENCOUNTER — Other Ambulatory Visit: Payer: Self-pay | Admitting: Podiatry

## 2022-08-26 DIAGNOSIS — M778 Other enthesopathies, not elsewhere classified: Secondary | ICD-10-CM | POA: Diagnosis not present

## 2022-08-26 DIAGNOSIS — I739 Peripheral vascular disease, unspecified: Secondary | ICD-10-CM

## 2022-08-26 DIAGNOSIS — M722 Plantar fascial fibromatosis: Secondary | ICD-10-CM

## 2022-08-26 DIAGNOSIS — R0989 Other specified symptoms and signs involving the circulatory and respiratory systems: Secondary | ICD-10-CM

## 2022-08-26 DIAGNOSIS — D2371 Other benign neoplasm of skin of right lower limb, including hip: Secondary | ICD-10-CM | POA: Diagnosis not present

## 2022-08-26 MED ORDER — TRIAMCINOLONE ACETONIDE 40 MG/ML IJ SUSP
40.0000 mg | Freq: Once | INTRAMUSCULAR | Status: AC
Start: 2022-08-26 — End: ?

## 2022-08-26 NOTE — Progress Notes (Addendum)
Subjective:  Patient ID: Daniel Pierce, male    DOB: 11-04-1950,  MRN: [id number] HPI Chief Complaint  Patient presents with   Foot Pain    5th MPJ bilateral (L>R) - aching from 5th toe down lateral side since early May, no injury, feels tight and swollen, numbness in the whole left foot, some days feels weak like foot is going to give out   New Patient (Initial Visit)    Est pt 2019    72 y.o. male presents with the above complaint.   ROS: Denies fever chills nausea vomiting muscle aches pains calf pain back pain chest pain shortness of breath.  He is also complaining of painful left calf after walking several feet and then having to sit and rest before he can walk the same distance again.  Past Medical History:  Diagnosis Date   Arthritis    CHF (congestive heart failure) (HCC)    Dysrhythmia    PVC's   Hypertension    Past Surgical History:  Procedure Laterality Date   BACK SURGERY     ENDOLYMPHATIC [facility] DECOMPRESSION Left 06/24/2021   Procedure: ENDOLYMPHATIC SAC DECOMPRESSION;  Surgeon: Newman Pies, MD;  Location: [facility];  Service: ENT;  Laterality: Left;   FOOT SURGERY Right    PAROTIDECTOMY Left 06/18/2020   Procedure: PAROTIDECTOMY WITH FACIAL NERVE DISSECTION;  Surgeon: Drema Halon, MD;  Location: [facility] OR;  Service: ENT;  Laterality: Left;    Current Outpatient Medications:    acetaminophen (TYLENOL) 650 MG CR tablet, Take 650 mg by mouth 2 (two) times daily as needed for pain., Disp: , Rfl:    metoprolol tartrate (LOPRESSOR) 25 MG tablet, Take 25 mg by mouth daily after supper., Disp: , Rfl:    triamterene-hydrochlorothiazide (DYAZIDE) 37.5-25 MG capsule, TAKE 1 EACH (1 CAPSULE TOTAL) BY MOUTH DAILY., Disp: 84 capsule, Rfl: 2  Current Facility-Administered Medications:    triamcinolone acetonide (KENALOG-40) injection 40 mg, 40 mg, Other, Once, Harmonee Tozer T, DPM  No Known Allergies Review of Systems Objective:  There were no vitals filed  for this visit.  General: Well developed, nourished, in no acute distress, alert and oriented x3   Dermatological: Skin is warm, dry and supple bilateral. Nails x 10 are well maintained; remaining integument appears unremarkable at this time. There are no open sores, no preulcerative lesions, no rash or signs of infection present.  Vascular: Dorsalis Pedis artery and Posterior Tibial artery pedal pulses are 0 /4 bilateral with immedate capillary fill time. Pedal hair growth present. No varicosities and no lower extremity edema present bilateral.   Neruologic: Grossly intact via light touch bilateral. Vibratory intact via tuning fork bilateral. Protective threshold with Semmes Wienstein monofilament intact to all pedal sites bilateral. Patellar and Achilles deep tendon reflexes 2+ bilateral. No Babinski or clonus noted bilateral.   Musculoskeletal: No gross boney pedal deformities bilateral. No pain, crepitus, or limitation noted with foot and ankle range of motion bilateral. Muscular strength 5/5 in all groups tested bilateral.  Moderate severe pain on palpation medial calcaneal tubercle with pain to the plantar forefoot and to the lateral aspect of the fifth metatarsal and fourth metatarsal TMT joint.  Gait: Unassisted, Nonantalgic.    Radiographs:  Radiographs of the left foot today demonstrate osseously mature individual with a silicone arthroplasty implant first metatarsal phalangeal joint of his right foot.  His left foot demonstrates soft tissue increase in density plantar fashion calcaneal insertion site consistent with plantar fasciitis.  No fractures  no acute findings noted.  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  Possible peripheral vascular disease.  Plan: Discussed etiology pathology conservative versus surgical therapies.  I injected his left heel today 20 mg Kenalog 5 mg and Marcaine to the point of maximal  tenderness.  Also injected the dorsal lateral aspect of the fourth fifth capsule or joint at the TMT left foot.  We discussed appropriate shoe gear stretch exercise ice therapy sugar modifications.  I will follow-up with him on an as-needed basis.  [place] refer him to vein and vascular for ABI testing and a consult with vascular surgery if necessary     Wilmarie Sparlin T. [place], [place]

## 2022-09-24 ENCOUNTER — Other Ambulatory Visit: Payer: Self-pay | Admitting: *Deleted

## 2022-09-24 DIAGNOSIS — M79606 Pain in leg, unspecified: Secondary | ICD-10-CM

## 2022-10-01 ENCOUNTER — Ambulatory Visit (HOSPITAL_COMMUNITY)
Admission: RE | Admit: 2022-10-01 | Discharge: 2022-10-01 | Disposition: A | Discharge status: Home / Self Care | Payer: Medicare Other | Source: Ambulatory Visit | Attending: Vascular Surgery | Admitting: Vascular Surgery

## 2022-10-01 ENCOUNTER — Ambulatory Visit (INDEPENDENT_AMBULATORY_CARE_PROVIDER_SITE_OTHER): Payer: Medicare Other | Admitting: Vascular Surgery

## 2022-10-01 ENCOUNTER — Encounter: Payer: Self-pay | Admitting: Vascular Surgery

## 2022-10-01 VITALS — BP 124/83 | HR 64 | Temp 97.9°F | Resp 20 | Ht 73.0 in | Wt 207.0 lb

## 2022-10-01 DIAGNOSIS — I70222 Atherosclerosis of native arteries of extremities with rest pain, left leg: Secondary | ICD-10-CM | POA: Diagnosis not present

## 2022-10-01 DIAGNOSIS — M79606 Pain in leg, unspecified: Secondary | ICD-10-CM | POA: Diagnosis present

## 2022-10-01 LAB — VAS US ABI WITH/WO TBI
Left ABI: 0.34
Right ABI: 0.58

## 2022-10-01 NOTE — Progress Notes (Signed)
Patient ID: Daniel Pierce, male   DOB: Dec 11, 1950, 72 y.o.   MRN: [id number]  Reason for Consult: New Patient (Initial Visit)   Referred by Elinor Parkinson, DPM  Subjective:     HPI:  Daniel Pierce is a 72 y.o. male without previous vascular disease does have hypertension and long history of smoking.  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  More recently he has pain in the left leg with very short distance claudication and even has pain at night.  States he occasionally has cramping on the right lower extremity but not pain at night.  He does have mild swelling of both legs as well but denies any tissue loss or ulceration.  No history of stroke, TIA or amaurosis and no personal or family history of aneurysm disease.  He has had coronary angiography in the past which she states was negative for any occlusive disease.  Past Medical History:  Diagnosis Date   Arthritis    CHF (congestive heart failure) (HCC)    Dysrhythmia    PVC's   Hypertension    History reviewed. No pertinent family history. Past Surgical History:  Procedure Laterality Date   BACK SURGERY     ENDOLYMPHATIC [facility] DECOMPRESSION Left 06/24/2021   Procedure: ENDOLYMPHATIC SAC DECOMPRESSION;  Surgeon: Newman Pies, MD;  Location: [facility];  Service: ENT;  Laterality: Left;   FOOT SURGERY Right    PAROTIDECTOMY Left 06/18/2020   Procedure: PAROTIDECTOMY WITH FACIAL NERVE DISSECTION;  Surgeon: Drema Halon, MD;  Location: [facility] OR;  Service: ENT;  Laterality: Left;    Short Social History:  Social History   Tobacco Use   Smoking status: Every Day    Current packs/day: 1.00    Average packs/day: 1 pack/day for 54.7 years (54.7 ttl pk-yrs)    Types: Cigarettes    Start date: 1970   Smokeless tobacco: Never  Substance Use Topics   Alcohol use: Not Currently    No Known Allergies  Current Outpatient Medications  Medication Sig  Dispense Refill   acetaminophen (TYLENOL) 650 MG CR tablet Take 650 mg by mouth 2 (two) times daily as needed for pain.     metoprolol tartrate (LOPRESSOR) 25 MG tablet Take 25 mg by mouth daily after supper.     triamterene-hydrochlorothiazide (DYAZIDE) 37.5-25 MG capsule TAKE 1 EACH (1 CAPSULE TOTAL) BY MOUTH DAILY. 84 capsule 2   Current Facility-Administered Medications  Medication Dose Route Frequency Provider Last Rate Last Admin   triamcinolone acetonide (KENALOG-40) injection 40 mg  40 mg Other Once [place], Max T, DPM        Review of Systems  Constitutional:  Constitutional negative. HENT: HENT negative.  Eyes: Eyes negative.  Cardiovascular: Positive for claudication and leg swelling.  GI: Gastrointestinal negative.  Musculoskeletal: Positive for leg pain.  Neurological: Neurological negative. Hematologic: Hematologic/lymphatic negative.  Psychiatric: Psychiatric negative.        Objective:  Objective   Vitals:   10/01/22 0827  BP: 124/83  Pulse: 64  Resp: 20  Temp: 97.9 F (36.6 C)  SpO2: 97%  Weight: 207 lb (93.9 kg)  Height: 6\' 1"  (1.854 m)   Body mass index is 27.31 kg/m.  Physical Exam HENT:     Head: Normocephalic.     Nose: Nose normal.  Eyes:     Pupils: Pupils are equal, round, and reactive  to light.  Neck:     Vascular: No carotid bruit.  Cardiovascular:     Pulses:          Femoral pulses are 0 on the right side and 0 on the left side. Pulmonary:     Effort: Pulmonary effort is normal.  Abdominal:     General: Abdomen is flat.     Palpations: Abdomen is soft. There is no mass.  Skin:    General: Skin is warm and dry.     Capillary Refill: Capillary refill takes more than 3 seconds.  Neurological:     General: No focal deficit present.     Mental Status: He is alert.  Psychiatric:        Mood and Affect: Mood normal.        Thought Content: Thought content normal.        Judgment: Judgment normal.     Data: ABI Findings:   +---------+------------------+-----+----------+--------+  Right   Rt Pressure (mmHg)IndexWaveform  Comment   +---------+------------------+-----+----------+--------+  Brachial 140                                        +---------+------------------+-----+----------+--------+  PTA     81                0.58 monophasic          +---------+------------------+-----+----------+--------+  DP      77                0.55 monophasic          +---------+------------------+-----+----------+--------+  Great Toe47                0.34 Abnormal            +---------+------------------+-----+----------+--------+   +---------+------------------+-----+-------------------+-------+  Left    Lt Pressure (mmHg)IndexWaveform           Comment  +---------+------------------+-----+-------------------+-------+  Brachial 132                                                +---------+------------------+-----+-------------------+-------+  PTA     48                0.34 dampened monophasic         +---------+------------------+-----+-------------------+-------+  DP      41                0.29 dampened monophasic         +---------+------------------+-----+-------------------+-------+  Great Toe28                0.20 Abnormal                    +---------+------------------+-----+-------------------+-------+   +-------+-----------+-----------+------------+------------+  ABI/TBIToday's ABIToday's TBIPrevious ABIPrevious TBI  +-------+-----------+-----------+------------+------------+  Right 0.58       0.34                                 +-------+-----------+-----------+------------+------------+  Left  0.34       0.20                                 +-------+-----------+-----------+------------+------------+  Summary:  Right: Resting right ankle-brachial index indicates moderate right lower  extremity arterial disease. The  right toe-brachial index is abnormal.   Left: Resting left ankle-brachial index indicates severe left lower  extremity arterial disease. The left toe-brachial index is abnormal.       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  As I cannot feel femoral pulses today I recommended CT angio with runoff and follow-up afterwards to discuss moving forward.  I have asked him to begin 81 mg aspirin daily and given he has had issues in the past this will be enteric-coated and I we have also discussed the need for smoking cessation.    Maeola Harman MD [facility]

## 2022-10-13 ENCOUNTER — Other Ambulatory Visit: Payer: Self-pay

## 2022-10-13 DIAGNOSIS — I70222 Atherosclerosis of native arteries of extremities with rest pain, left leg: Secondary | ICD-10-CM

## 2022-10-16 ENCOUNTER — Ambulatory Visit (HOSPITAL_COMMUNITY)
Admission: RE | Admit: 2022-10-16 | Discharge: 2022-10-16 | Disposition: A | Discharge status: Home / Self Care | Payer: Medicare Other | Source: Ambulatory Visit | Attending: Vascular Surgery | Admitting: Vascular Surgery

## 2022-10-16 DIAGNOSIS — I70222 Atherosclerosis of native arteries of extremities with rest pain, left leg: Secondary | ICD-10-CM | POA: Insufficient documentation

## 2022-10-16 MED ORDER — IOHEXOL 350 MG/ML SOLN
100.0000 mL | Freq: Once | INTRAVENOUS | Status: AC | PRN
  Administered 2022-10-16: 100 mL via INTRAVENOUS

## 2022-10-29 ENCOUNTER — Encounter: Payer: Self-pay | Admitting: Vascular Surgery

## 2022-10-29 ENCOUNTER — Ambulatory Visit (INDEPENDENT_AMBULATORY_CARE_PROVIDER_SITE_OTHER): Payer: Medicare Other | Admitting: Vascular Surgery

## 2022-10-29 VITALS — BP 115/76 | HR 67 | Temp 98.1°F | Ht 73.0 in | Wt 211.4 lb

## 2022-10-29 DIAGNOSIS — I70222 Atherosclerosis of native arteries of extremities with rest pain, left leg: Secondary | ICD-10-CM | POA: Diagnosis not present

## 2022-10-29 NOTE — Progress Notes (Signed)
Patient ID: RAYMUND BREITENBACH, male   DOB: 1950-03-14, 72 y.o.   MRN: [id number]  Reason for Consult: Follow-up   Referred by No ref. provider found  Subjective:     HPI:  NYAIR SAMPAIO is a 72 y.o. male follows up for left lower extremity rest pain which short distance claudication.  Similarly has claudication on the right.  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  Recently was evaluated by his cardiologist Dr. Teofilo Pod who prescribed Pletal but he has not been taking.  He has also not been on aspirin after our discussion at last visit.  He does not have any wounds but states that he does have occasional numbness of the left foot on top of his usual rest pain and claudication.  Patient continues to smoke daily.  Past Medical History:  Diagnosis Date   Arthritis    CHF (congestive heart failure) (HCC)    Dysrhythmia    PVC's   Hypertension    Peripheral arterial disease (HCC)    History reviewed. No pertinent family history. Past Surgical History:  Procedure Laterality Date   BACK SURGERY     ENDOLYMPHATIC [facility] DECOMPRESSION Left 06/24/2021   Procedure: ENDOLYMPHATIC SAC DECOMPRESSION;  Surgeon: Newman Pies, MD;  Location: [facility];  Service: ENT;  Laterality: Left;   FOOT SURGERY Right    PAROTIDECTOMY Left 06/18/2020   Procedure: PAROTIDECTOMY WITH FACIAL NERVE DISSECTION;  Surgeon: Drema Halon, MD;  Location: [facility] OR;  Service: ENT;  Laterality: Left;    Short Social History:  Social History   Tobacco Use   Smoking status: Every Day    Current packs/day: 1.00    Average packs/day: 1 pack/day for 54.8 years (54.8 ttl pk-yrs)    Types: Cigarettes    Start date: 1970   Smokeless tobacco: Never  Substance Use Topics   Alcohol use: Not Currently    No Known Allergies  Current Outpatient Medications  Medication Sig Dispense Refill   acetaminophen (TYLENOL) 650 MG CR tablet Take 650 mg by mouth 2 (two) times daily as needed for pain.      metoprolol tartrate (LOPRESSOR) 25 MG tablet Take 25 mg by mouth daily after supper.     triamterene-hydrochlorothiazide (DYAZIDE) 37.5-25 MG capsule TAKE 1 EACH (1 CAPSULE TOTAL) BY MOUTH DAILY. 84 capsule 2   Current Facility-Administered Medications  Medication Dose Route Frequency Provider Last Rate Last Admin   triamcinolone acetonide (KENALOG-40) injection 40 mg  40 mg Other Once [place], Max T, DPM        Review of Systems  Constitutional:  Constitutional negative. HENT: HENT negative.  Eyes: Eyes negative.  Respiratory: Respiratory negative.  Cardiovascular: Positive for claudication.  GI: Gastrointestinal negative.  Musculoskeletal: Positive for leg pain.  Neurological: Positive for numbness.  Hematologic: Hematologic/lymphatic negative.  Psychiatric: Psychiatric negative.        Objective:  Objective   Vitals:   10/29/22 1417  BP: 115/76  Pulse: 67  Temp: 98.1 F (36.7 C)  TempSrc: Temporal  SpO2: 96%  Weight: 211 lb 6.4 oz (95.9 kg)  Height: 6\' 1"  (1.854 m)   Body mass index is 27.89 kg/m.  Physical Exam HENT:     Head: Normocephalic.     Nose: Nose normal.  Eyes:     Pupils: Pupils are equal, round, and reactive to light.  Cardiovascular:     Pulses:          Femoral pulses are 2+ on the  right side and 0 on the left side. Pulmonary:     Effort: Pulmonary effort is normal.  Abdominal:     General: Abdomen is flat.     Palpations: Abdomen is soft.  Musculoskeletal:     Cervical back: Normal range of motion.     Right lower leg: No edema.     Left lower leg: No edema.  Skin:    Capillary Refill: Capillary refill takes more than 3 seconds.  Neurological:     General: No focal deficit present.     Mental Status: He is alert.  Psychiatric:        Mood and Affect: Mood normal.     Data: CTA IMPRESSION: VASCULAR   1. Advanced left lower extremity peripheral arterial disease with chronic occlusion of the left external iliac artery as well  as severe femoropopliteal disease with areas of critical stenosis and segmental occlusion affecting the superficial femoral artery and a region of focal moderate stenosis in the distal popliteal artery. 2. Significant right femoropopliteal disease with segmental occlusion of the superficial femoral artery. 3. There appears to be patent 3 vessel runoff to the ankles bilaterally. 4. Diminutive native renal arteries with at least moderate stenoses proximally. 5. 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 6. Mild stenosis of the proximal SMA. 7.  Aortic Atherosclerosis (ICD10-I70.0). 8. Coronary artery atherosclerotic vascular calcifications.   NON-VASCULAR   1. No acute abnormality within the abdomen or pelvis. 2. Bilateral renal cortical atrophy. 3. 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 4. 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 5. Hepatic cirrhosis.       Assessment/Plan:    72 year old male with bilateral lower extremity claudication and left lower extremity rest pain with severely depressed ABIs on the left and no palpable left common femoral pulse with CT scanning evidence of occluded left external iliac artery.  We have discussed proceeding with surgical repair to include left common femoral endarterectomy with possible retrograde stenting and attempted antegrade stenting if necessary.  Worse Scenario would be right to left femorofemoral bypass.  This will be performed on Tuesday in the near future and would expect at least 1 night stay in the hospital.  I have again reiterated the need to take aspirin and for smoking cessation.  We will reach out to his cardiology office for clearance and have him scheduled in the near future for above-noted procedure.  All questions were answered.     Maeola Harman MD [facility]

## 2022-11-03 ENCOUNTER — Ambulatory Visit (INDEPENDENT_AMBULATORY_CARE_PROVIDER_SITE_OTHER): Payer: Medicare Other

## 2022-11-03 ENCOUNTER — Encounter (INDEPENDENT_AMBULATORY_CARE_PROVIDER_SITE_OTHER): Payer: Self-pay

## 2022-11-03 VITALS — Ht 73.0 in | Wt 205.0 lb

## 2022-11-03 DIAGNOSIS — J343 Hypertrophy of nasal turbinates: Secondary | ICD-10-CM

## 2022-11-03 DIAGNOSIS — H8102 Meniere's disease, left ear: Secondary | ICD-10-CM

## 2022-11-03 DIAGNOSIS — J310 Chronic rhinitis: Secondary | ICD-10-CM

## 2022-11-03 DIAGNOSIS — R42 Dizziness and giddiness: Secondary | ICD-10-CM

## 2022-11-03 DIAGNOSIS — H9042 Sensorineural hearing loss, unilateral, left ear, with unrestricted hearing on the contralateral side: Secondary | ICD-10-CM | POA: Diagnosis not present

## 2022-11-03 DIAGNOSIS — R0982 Postnasal drip: Secondary | ICD-10-CM

## 2022-11-03 NOTE — Progress Notes (Unsigned)
Patient ID: Daniel Pierce, male   DOB: 1950-05-11, 71 y.o.   MRN: [id number]  Follow-up: Recurrent dizziness, Mnire's disease, hearing loss, tinnitus, nasal congestion, postnasal drainage  HPI: The patient is a 72 year old male who returns today for his follow-up evaluation.  The patient was previously seen for multiple medical issues, including recurrent dizziness, left ear hearing loss, chronic nasal congestion, and chronic nasal drainage.  He was diagnosed with left ear Mnire's disease.  He was treated with left endolymphatic sac decompression surgery in June 2023.  He was placed on Dyazide diuretic and low-salt diet.  He was also treated with Flonase, Atrovent, and Singulair.  The patient returns today reporting no significant vertigo since his last visit.  However, he is still having difficulty with his balance.  He also denies any change in his left ear hearing.  He also reports persistent nasal congestion and postnasal drainage.  Currently he is on Singulair, Atrovent, and Flonase nasal sprays.  He denies any facial pain, fever, or visual change.  Exam: General: Communicates without difficulty, well nourished, no acute distress. Head: Normocephalic, no evidence injury, no tenderness, facial buttresses intact without stepoff. Face/sinus: No tenderness to palpation and percussion. Facial movement is normal and symmetric. Eyes: PERRL, EOMI. No scleral icterus, conjunctivae clear. Neuro: CN II exam reveals vision grossly intact.  No nystagmus at any point of gaze. Ears: Auricles well formed without lesions.  Ear canals are intact without mass or lesion.  No erythema or edema is appreciated.  The TMs are intact without fluid. Nose: External evaluation reveals normal support and skin without lesions.  Dorsum is intact.  Anterior rhinoscopy reveals congested mucosa over anterior aspect of inferior turbinates and intact septum.  No purulence noted. Oral:  Oral cavity and oropharynx are intact, symmetric,  without erythema or edema.  Mucosa is moist without lesions. Neck: Full range of motion without pain.  There is no significant lymphadenopathy.  No masses palpable.  Thyroid bed within normal limits to palpation.  The left parotid incision is well-healed.  Trachea is midline. Neuro:  CN 2-12 grossly intact. Gait wide-based. Vestibular: No nystagmus at any point of gaze. Vestibular: There is no nystagmus with pneumatic pressure on either tympanic membrane or Valsalva. The cerebellar examination is unremarkable.   Procedure:  Flexible Nasal Endoscopy: Description: Risks, benefits, and alternatives of flexible endoscopy were explained to the patient.  Specific mention was made of the risk of throat numbness with difficulty swallowing, possible bleeding from the nose and mouth, and pain from the procedure.  The patient gave oral consent to proceed.  The flexible scope was inserted into the right nasal cavity.  Endoscopy of the interior nasal cavity, superior, inferior, and middle meatus was performed. The sphenoid-ethmoid recess was examined. Edematous mucosa was noted.  No polyp, mass, or lesion was appreciated. Olfactory cleft was clear.  Nasopharynx with postnasal drainage.  Turbinates were hypertrophied but without mass.  The procedure was repeated on the contralateral side with similar findings.  The patient tolerated the procedure well.   Assessment: 1.  Chronic rhinitis with nasal mucosal congestion and bilateral inferior turbinate hypertrophy. 2.  No acute sinus infection is noted today. 3.  Left ear Mnire's disease, currently under control after the endolymphatic sac decompression surgery.  However, he continues to have difficulty with his balance. 4.  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  Plan: 1.  The physical exam and nasal endoscopy findings are reviewed with the patient. 2.  Continue with Singulair,  Flonase nasal spray and Atrovent nasal spray. 3.  Continue  with Dyazide diuretic and low-salt diet.  4.  The patient is a candidate for hearing amplification.  The hearing aid options are discussed. 5.  The patient will return for reevaluation in 6 months, sooner if needed.

## 2022-11-04 DIAGNOSIS — J343 Hypertrophy of nasal turbinates: Secondary | ICD-10-CM | POA: Insufficient documentation

## 2022-11-04 DIAGNOSIS — R42 Dizziness and giddiness: Secondary | ICD-10-CM | POA: Insufficient documentation

## 2022-11-04 DIAGNOSIS — R0982 Postnasal drip: Secondary | ICD-10-CM | POA: Insufficient documentation

## 2022-11-04 DIAGNOSIS — J310 Chronic rhinitis: Secondary | ICD-10-CM | POA: Insufficient documentation

## 2022-11-04 DIAGNOSIS — H9042 Sensorineural hearing loss, unilateral, left ear, with unrestricted hearing on the contralateral side: Secondary | ICD-10-CM | POA: Insufficient documentation

## 2022-11-13 ENCOUNTER — Other Ambulatory Visit: Payer: Self-pay

## 2022-11-13 DIAGNOSIS — Z419 Encounter for procedure for purposes other than remedying health state, unspecified: Secondary | ICD-10-CM

## 2022-11-13 DIAGNOSIS — I70222 Atherosclerosis of native arteries of extremities with rest pain, left leg: Secondary | ICD-10-CM

## 2022-11-28 NOTE — Progress Notes (Signed)
Surgical Instructions   Your procedure is scheduled on Tuesday, November 26th, 2024. Report to [facility] Main Entrance "A" at 5:30 A.M., then check in with the Admitting office. Any questions or running late day of surgery: call [phone]  Questions prior to your surgery date: call [phone], Monday-Friday, 8am-4pm. If you experience any cold or flu symptoms such as cough, fever, chills, shortness of breath, etc. between now and your scheduled surgery, please notify us at the above number.     Remember:  Do not eat or drink after midnight the night before your surgery    Take these medicines the morning of surgery with A SIP OF WATER:  Aspirin   May take these medicines IF NEEDED: None.    One week prior to surgery, STOP taking any Aspirin (unless otherwise instructed by your surgeon) Aleve, Naproxen, Ibuprofen, Motrin, Advil, Goody's, BC's, all herbal medications, fish oil, and non-prescription vitamins.                     Do NOT Smoke (Tobacco/Vaping) for 24 hours prior to your procedure.  If you use a CPAP at night, you may bring your mask/headgear for your overnight stay.   You will be asked to remove any contacts, glasses, piercing's, hearing aid's, dentures/partials prior to surgery. Please bring cases for these items if needed.    Patients discharged the day of surgery will not be allowed to drive home, and someone needs to stay with them for 24 hours.  SURGICAL WAITING ROOM VISITATION Patients may have no more than 2 support people in the waiting area - these visitors may rotate.   Pre-op nurse will coordinate an appropriate time for 1 ADULT support person, who may not rotate, to accompany patient in pre-op.  Children under the age of 27 must have an adult with them who is not the patient and must remain in the main waiting area with an adult.  If the patient needs to stay at the hospital during part of their recovery, the visitor guidelines for inpatient rooms  apply.  Please refer to the [facility] website for the visitor guidelines for any additional information.   If you received a COVID test during your pre-op visit  it is requested that you wear a mask when out in public, stay away from anyone that may not be feeling well and notify your surgeon if you develop symptoms. If you have been in contact with anyone that has tested positive in the last 10 days please notify you surgeon.      Pre-operative CHG Bathing Instructions   You can play a key role in reducing the risk of infection after surgery. Your skin needs to be as free of germs as possible. You can reduce the number of germs on your skin by washing with CHG (chlorhexidine gluconate) soap before surgery. CHG is an antiseptic soap that kills germs and continues to kill germs even after washing.   DO NOT use if you have an allergy to chlorhexidine/CHG or antibacterial soaps. If your skin becomes reddened or irritated, stop using the CHG and notify one of our RNs at [phone].              TAKE A SHOWER THE NIGHT BEFORE SURGERY AND THE DAY OF SURGERY    Please keep in mind the following:  DO NOT shave, including legs and underarms, 48 hours prior to surgery.   You may shave your face before/day of surgery.  Place  clean sheets on your bed the night before surgery Use a clean washcloth (not used since being washed) for each shower. DO NOT sleep with pet's night before surgery.  CHG Shower Instructions:  Wash your face and private area with normal soap. If you choose to wash your hair, wash first with your normal shampoo.  After you use shampoo/soap, rinse your hair and body thoroughly to remove shampoo/soap residue.  Turn the water OFF and apply half the bottle of CHG soap to a CLEAN washcloth.  Apply CHG soap ONLY FROM YOUR NECK DOWN TO YOUR TOES (washing for 3-5 minutes)  DO NOT use CHG soap on face, private areas, open wounds, or sores.  Pay special attention to the area where  your surgery is being performed.  If you are having back surgery, having someone wash your back for you may be helpful. Wait 2 minutes after CHG soap is applied, then you may rinse off the CHG soap.  Pat dry with a clean towel  Put on clean pajamas    Additional instructions for the day of surgery: DO NOT APPLY any lotions, deodorants, cologne, or perfumes.   Do not wear jewelry or makeup Do not wear nail polish, gel polish, artificial nails, or any other type of covering on natural nails (fingers and toes) Do not bring valuables to the hospital. [facility] is not responsible for valuables/personal belongings. Put on clean/comfortable clothes.  Please brush your teeth.  Ask your nurse before applying any prescription medications to the skin.

## 2022-12-01 ENCOUNTER — Encounter (HOSPITAL_COMMUNITY): Payer: Self-pay

## 2022-12-01 ENCOUNTER — Other Ambulatory Visit: Payer: Self-pay

## 2022-12-01 ENCOUNTER — Encounter (HOSPITAL_COMMUNITY)
Admission: RE | Admit: 2022-12-01 | Discharge: 2022-12-01 | Disposition: A | Discharge status: Home / Self Care | Payer: Medicare Other | Source: Ambulatory Visit | Attending: Vascular Surgery | Admitting: Vascular Surgery

## 2022-12-01 VITALS — BP 135/56 | HR 36 | Temp 98.1°F | Resp 18 | Ht 73.0 in | Wt 210.3 lb

## 2022-12-01 DIAGNOSIS — I428 Other cardiomyopathies: Secondary | ICD-10-CM | POA: Insufficient documentation

## 2022-12-01 DIAGNOSIS — Z01818 Encounter for other preprocedural examination: Secondary | ICD-10-CM | POA: Insufficient documentation

## 2022-12-01 DIAGNOSIS — I70222 Atherosclerosis of native arteries of extremities with rest pain, left leg: Secondary | ICD-10-CM | POA: Insufficient documentation

## 2022-12-01 DIAGNOSIS — I2510 Atherosclerotic heart disease of native coronary artery without angina pectoris: Secondary | ICD-10-CM | POA: Insufficient documentation

## 2022-12-01 DIAGNOSIS — I493 Ventricular premature depolarization: Secondary | ICD-10-CM | POA: Insufficient documentation

## 2022-12-01 DIAGNOSIS — Z419 Encounter for procedure for purposes other than remedying health state, unspecified: Secondary | ICD-10-CM

## 2022-12-01 DIAGNOSIS — I10 Essential (primary) hypertension: Secondary | ICD-10-CM | POA: Insufficient documentation

## 2022-12-01 DIAGNOSIS — F17210 Nicotine dependence, cigarettes, uncomplicated: Secondary | ICD-10-CM | POA: Insufficient documentation

## 2022-12-01 DIAGNOSIS — I340 Nonrheumatic mitral (valve) insufficiency: Secondary | ICD-10-CM | POA: Insufficient documentation

## 2022-12-01 HISTORY — DX: Unspecified hearing loss, unspecified ear: H91.90

## 2022-12-01 HISTORY — DX: Unspecified malignant neoplasm of skin, unspecified: C44.90

## 2022-12-01 HISTORY — DX: Meniere's disease, left ear: H81.02

## 2022-12-01 LAB — TYPE AND SCREEN
ABO/RH(D): O POS
Antibody Screen: NEGATIVE

## 2022-12-01 LAB — COMPREHENSIVE METABOLIC PANEL
ALT: 28 U/L (ref 0–44)
AST: 23 U/L (ref 15–41)
Albumin: 4.1 g/dL (ref 3.5–5.0)
Alkaline Phosphatase: 61 U/L (ref 38–126)
Anion gap: 10 (ref 5–15)
BUN: 28 mg/dL — ABNORMAL HIGH (ref 8–23)
CO2: 25 mmol/L (ref 22–32)
Calcium: 9.8 mg/dL (ref 8.9–10.3)
Chloride: 102 mmol/L (ref 98–111)
Creatinine, Ser: 1.62 mg/dL — ABNORMAL HIGH (ref 0.61–1.24)
GFR, Estimated: 45 mL/min — ABNORMAL LOW (ref >60)
Glucose, Bld: 116 mg/dL — ABNORMAL HIGH (ref 70–99)
Potassium: 3.8 mmol/L (ref 3.5–5.1)
Sodium: 137 mmol/L (ref 135–145)
Total Bilirubin: 0.9 mg/dL (ref <1.2)
Total Protein: 7.1 g/dL (ref 6.5–8.1)

## 2022-12-01 LAB — SURGICAL PCR SCREEN
MRSA, PCR: NEGATIVE
Staphylococcus aureus: NEGATIVE

## 2022-12-01 LAB — CBC
HCT: 44 % (ref 39.0–52.0)
Hemoglobin: 15.1 g/dL (ref 13.0–17.0)
MCH: 33.2 pg (ref 26.0–34.0)
MCHC: 34.3 g/dL (ref 30.0–36.0)
MCV: 96.7 fL (ref 80.0–100.0)
Platelets: 209 10*3/uL (ref 150–400)
RBC: 4.55 MIL/uL (ref 4.22–5.81)
RDW: 12.4 % (ref 11.5–15.5)
WBC: 10.3 10*3/uL (ref 4.0–10.5)
nRBC: 0 % (ref 0.0–0.2)

## 2022-12-01 LAB — PROTIME-INR
INR: 1 (ref 0.8–1.2)
Prothrombin Time: 13.1 s (ref 11.4–15.2)

## 2022-12-01 LAB — APTT: aPTT: 32 s (ref 24–36)

## 2022-12-01 NOTE — Progress Notes (Signed)
Anesthesia Chart Review:  72 year old male follows with cardiologist Dr. Teofilo Pod for history of nonischemic cardiomyopathy and frequent PVCs. Per notes, he had a cardiac catheterization in November 2015 showing mild nonobstructive coronary disease.  He had nonischemic cardiomyopathy (secondary to untreated HTN vs tachycardia induced) which had resolved on echo 12/2014.  He was last seen by Dr. Teofilo Pod on 10/21/2022.  Per note, he continues to have good exercise tolerance and no symptoms of angina.  Echo was ordered at that time for continued monitoring of LV function.  Subsequent echo in 2024 showed EF 55 to 60%, grade 1 DD, mild aortic stenosis (mean gradient 12 mmHg), mild aortic regurgitation.  He was cleared for surgery letter dated 11/10/2022 stating, "this is to inform you that the above-mentioned patient has good exercise tolerance and no symptoms of angina.  He had a cardiac catheterization performed 11/2013 which showed only mild nonobstructive coronary disease.  This patient had echocardiogram performed in 10/2022 which showed normal left ventricular function EF 55 to 60%, normal RV size and function, normal pulmonary artery pressure, and no significant valvular disease.  I feel this patient is low risk from cardiac standpoint to proceed with vascular surgery."  Current smoker.  Preop labs reviewed, creatinine mildly elevated at 1.62 (appears to be near baseline), otherwise unremarkable.  TTE 11/03/2022 (Care Everywhere): Conclusions Summary: 1. Left ventricle: The cavity size is normal. Wall thickness is normal. Systolic function is normal. The closest LVEF is 55-60%. Segmental wall motion is normal. Grade I diastolic dysfunction. 2. Aortic valve: There is mild stenosis (peak velocity 2.5 m/s, peak gradient 24 mmHg, mean gradient 12 mmHg). There is mild regurgitation. 3. Mitral valve: The annulus is mildly calcified. There is mild regurgitation. 4. Right ventricle: The cavity size is normal. Systolic  function is normal. 5. Pulmonic valve: There is mild regurgitation. 6. Pulmonary arteries: There is no evidence of pulmonary hypertension. 7. Tricuspid valve: There is mild regurgitation.      Zannie Cove [facility] Short Stay Center/Anesthesiology Phone [phone] 12/01/2022 1:53 PM

## 2022-12-01 NOTE — Anesthesia Preprocedure Evaluation (Signed)
Anesthesia Evaluation  Patient identified by MRN, date of birth, ID band Patient awake    Reviewed: Allergy & Precautions, NPO status , Patient's Chart, lab work & pertinent test results  Airway Mallampati: II   Neck ROM: Full    Dental  (+) Dental Advisory Given   Pulmonary Current Smoker and Patient abstained from smoking.   breath sounds clear to auscultation       Cardiovascular hypertension, Pt. on medications and Pt. on home beta blockers + CAD and + Peripheral Vascular Disease  + dysrhythmias  Rhythm:Regular Rate:Normal     Neuro/Psych negative neurological ROS     GI/Hepatic negative GI ROS, Neg liver ROS,,,  Endo/Other  negative endocrine ROS    Renal/GU Renal Insufficiency and CRFRenal disease     Musculoskeletal  (+) Arthritis ,    Abdominal   Peds  Hematology negative hematology ROS (+)   Anesthesia Other Findings   Reproductive/Obstetrics                             Anesthesia Physical Anesthesia Plan  ASA: 3  Anesthesia Plan: General   Post-op Pain Management: Tylenol PO (pre-op)*   Induction: Intravenous  PONV Risk Score and Plan: 1 and Dexamethasone, Ondansetron, Treatment may vary due to age or medical condition and Midazolam  Airway Management Planned: Oral ETT  Additional Equipment: Arterial line  Intra-op Plan:   Post-operative Plan: Extubation in OR  Informed Consent: I have reviewed the patients History and Physical, chart, labs and discussed the procedure including the risks, benefits and alternatives for the proposed anesthesia with the patient or authorized representative who has indicated his/her understanding and acceptance.       Plan Discussed with: CRNA  Anesthesia Plan Comments: (PAT note by Antionette Poles, PA-C:  72 year old male follows with cardiologist Dr. Teofilo Pod for history of nonischemic cardiomyopathy and frequent PVCs. Per notes, he  had a cardiac catheterization in November 2015 showing mild nonobstructive coronary disease.  He had nonischemic cardiomyopathy (secondary to untreated HTN vs tachycardia induced) which had resolved on echo 12/2014.  He was last seen by Dr. Teofilo Pod on 10/21/2022.  Per note, he continues to have good exercise tolerance and no symptoms of angina.  Echo was ordered at that time for continued monitoring of LV function.  Subsequent echo in 2024 showed EF 55 to 60%, grade 1 DD, mild aortic stenosis (mean gradient 12 mmHg), mild aortic regurgitation.  He was cleared for surgery letter dated 11/10/2022 stating, "this is to inform you that the above-mentioned patient has good exercise tolerance and no symptoms of angina.  He had a cardiac catheterization performed 11/2013 which showed only mild nonobstructive coronary disease.  This patient had echocardiogram performed in 10/2022 which showed normal left ventricular function EF 55 to 60%, normal RV size and function, normal pulmonary artery pressure, and no significant valvular disease.  I feel this patient is low risk from cardiac standpoint to proceed with vascular surgery."  Current smoker.  Preop labs reviewed, creatinine mildly elevated at 1.62 (appears to be near baseline), otherwise unremarkable.  TTE 11/03/2022 (Care Everywhere): Conclusions Summary: 1. Left ventricle: The cavity size is normal. Wall thickness is normal. Systolic function is normal. The closest LVEF is 55-60%. Segmental wall motion is normal. Grade I diastolic dysfunction. 2. Aortic valve: There is mild stenosis (peak velocity 2.5 m/s, peak gradient 24 mmHg, mean gradient 12 mmHg). There is mild regurgitation. 3. Mitral valve: The annulus  is mildly calcified. There is mild regurgitation. 4. Right ventricle: The cavity size is normal. Systolic function is normal. 5. Pulmonic valve: There is mild regurgitation. 6. Pulmonary arteries: There is no evidence of pulmonary hypertension. 7. Tricuspid  valve: There is mild regurgitation.   )        Anesthesia Quick Evaluation

## 2022-12-01 NOTE — Progress Notes (Signed)
PAT called to let us know pt was unable to provide urine for his pre admission testing appt U/A. MD is aware.

## 2022-12-01 NOTE — Progress Notes (Addendum)
PCP - denies Cardiologist - Dr. Yehuda Budd  PPM/ICD - denies  Chest x-ray - denies EKG - 12/01/22 Stress Test - requested ECHO - 11/03/22 Cardiac Cath - denies  Sleep Study - denies   Fasting Blood Sugar - denies   Last dose of GLP1 agonist-  n/a GLP1 instructions: n/a  Blood Thinner Instructions: n/a Aspirin Instructions: continue Aspirin  ERAS Protcol - NPO  COVID TEST- n/a   Anesthesia review: yes  Patient denies shortness of breath, fever, cough and chest pain at PAT appointment   All instructions explained to the patient, with a verbal understanding of the material. Patient agrees to go over the instructions while at home for a better understanding. Patient also instructed to self quarantine after being tested for COVID-19. The opportunity to ask questions was provided.  HR was 36 on arrival to PAT.  EKG was obtained and HR 76.  Daniel Pierce made aware.    Patient unable to produce urine at PAT appointment.  Notified Dr. Darcella Cheshire office.

## 2022-12-02 ENCOUNTER — Other Ambulatory Visit: Payer: Self-pay

## 2022-12-02 ENCOUNTER — Inpatient Hospital Stay (HOSPITAL_COMMUNITY): Payer: Medicare Other

## 2022-12-02 ENCOUNTER — Encounter (HOSPITAL_COMMUNITY): Payer: Self-pay | Admitting: Vascular Surgery

## 2022-12-02 ENCOUNTER — Inpatient Hospital Stay (HOSPITAL_COMMUNITY): Payer: Medicare Other | Admitting: Physician Assistant

## 2022-12-02 ENCOUNTER — Encounter (HOSPITAL_COMMUNITY)
Admission: RE | Disposition: A | Discharge status: Home / Self Care | Payer: Self-pay | Source: Home / Self Care | Attending: Vascular Surgery

## 2022-12-02 ENCOUNTER — Inpatient Hospital Stay (HOSPITAL_COMMUNITY)
Admission: RE | Admit: 2022-12-02 | Discharge: 2022-12-03 | DRG: 271 | Disposition: A | Discharge status: Home / Self Care | Payer: Medicare Other | Attending: Vascular Surgery | Admitting: Vascular Surgery

## 2022-12-02 ENCOUNTER — Inpatient Hospital Stay (HOSPITAL_COMMUNITY): Payer: Self-pay | Admitting: Anesthesiology

## 2022-12-02 DIAGNOSIS — F17210 Nicotine dependence, cigarettes, uncomplicated: Secondary | ICD-10-CM

## 2022-12-02 DIAGNOSIS — I70222 Atherosclerosis of native arteries of extremities with rest pain, left leg: Secondary | ICD-10-CM

## 2022-12-02 DIAGNOSIS — Z79899 Other long term (current) drug therapy: Secondary | ICD-10-CM

## 2022-12-02 DIAGNOSIS — I493 Ventricular premature depolarization: Secondary | ICD-10-CM | POA: Diagnosis present

## 2022-12-02 DIAGNOSIS — I080 Rheumatic disorders of both mitral and aortic valves: Secondary | ICD-10-CM | POA: Diagnosis present

## 2022-12-02 DIAGNOSIS — I739 Peripheral vascular disease, unspecified: Secondary | ICD-10-CM | POA: Diagnosis present

## 2022-12-02 DIAGNOSIS — I745 Embolism and thrombosis of iliac artery: Secondary | ICD-10-CM | POA: Diagnosis present

## 2022-12-02 DIAGNOSIS — I509 Heart failure, unspecified: Secondary | ICD-10-CM | POA: Diagnosis present

## 2022-12-02 DIAGNOSIS — I70202 Unspecified atherosclerosis of native arteries of extremities, left leg: Secondary | ICD-10-CM | POA: Diagnosis present

## 2022-12-02 DIAGNOSIS — I10 Essential (primary) hypertension: Secondary | ICD-10-CM | POA: Diagnosis present

## 2022-12-02 DIAGNOSIS — I2510 Atherosclerotic heart disease of native coronary artery without angina pectoris: Secondary | ICD-10-CM | POA: Diagnosis present

## 2022-12-02 DIAGNOSIS — I110 Hypertensive heart disease with heart failure: Secondary | ICD-10-CM | POA: Diagnosis present

## 2022-12-02 DIAGNOSIS — I743 Embolism and thrombosis of arteries of the lower extremities: Secondary | ICD-10-CM | POA: Diagnosis not present

## 2022-12-02 DIAGNOSIS — I70211 Atherosclerosis of native arteries of extremities with intermittent claudication, right leg: Secondary | ICD-10-CM | POA: Diagnosis not present

## 2022-12-02 DIAGNOSIS — I350 Nonrheumatic aortic (valve) stenosis: Secondary | ICD-10-CM | POA: Diagnosis present

## 2022-12-02 HISTORY — PX: ENDARTERECTOMY FEMORAL: SHX5804

## 2022-12-02 HISTORY — PX: INSERTION OF ILIAC STENT: SHX6256

## 2022-12-02 HISTORY — PX: PATCH ANGIOPLASTY: SHX6230

## 2022-12-02 LAB — URINALYSIS, ROUTINE W REFLEX MICROSCOPIC
Bacteria, UA: NONE SEEN
Bilirubin Urine: NEGATIVE
Glucose, UA: NEGATIVE mg/dL
Hgb urine dipstick: NEGATIVE
Ketones, ur: NEGATIVE mg/dL
Leukocytes,Ua: NEGATIVE
Nitrite: NEGATIVE
Protein, ur: NEGATIVE mg/dL
Specific Gravity, Urine: 1.024 (ref 1.005–1.030)
pH: 5 (ref 5.0–8.0)

## 2022-12-02 LAB — CBC
HCT: 43.2 % (ref 39.0–52.0)
Hemoglobin: 14.5 g/dL (ref 13.0–17.0)
MCH: 32.2 pg (ref 26.0–34.0)
MCHC: 33.6 g/dL (ref 30.0–36.0)
MCV: 95.8 fL (ref 80.0–100.0)
Platelets: 190 10*3/uL (ref 150–400)
RBC: 4.51 MIL/uL (ref 4.22–5.81)
RDW: 12.4 % (ref 11.5–15.5)
WBC: 11.4 10*3/uL — ABNORMAL HIGH (ref 4.0–10.5)
nRBC: 0 % (ref 0.0–0.2)

## 2022-12-02 LAB — CREATININE, SERUM
Creatinine, Ser: 1.7 mg/dL — ABNORMAL HIGH (ref 0.61–1.24)
GFR, Estimated: 43 mL/min — ABNORMAL LOW (ref >60)

## 2022-12-02 LAB — ABO/RH: ABO/RH(D): O POS

## 2022-12-02 SURGERY — ENDARTERECTOMY, FEMORAL
Anesthesia: General | Site: Groin | Laterality: Left

## 2022-12-02 MED ORDER — DEXAMETHASONE SODIUM PHOSPHATE 10 MG/ML IJ SOLN
INTRAMUSCULAR | Status: DC | PRN
  Administered 2022-12-02: 10 mg via INTRAVENOUS

## 2022-12-02 MED ORDER — HEPARIN SODIUM (PORCINE) 1000 UNIT/ML IJ SOLN
INTRAMUSCULAR | Status: AC
Start: 2022-12-02 — End: ?
  Filled 2022-12-02: qty 20

## 2022-12-02 MED ORDER — TRIAMTERENE-HCTZ 37.5-25 MG PO CAPS
1.0000 | ORAL_CAPSULE | Freq: Every day | ORAL | Status: DC
  Administered 2022-12-02: 1 via ORAL
  Filled 2022-12-02 (×2): qty 1

## 2022-12-02 MED ORDER — SUGAMMADEX SODIUM 200 MG/2ML IV SOLN
INTRAVENOUS | Status: DC | PRN
  Administered 2022-12-02: 180 mg via INTRAVENOUS

## 2022-12-02 MED ORDER — ROCURONIUM BROMIDE 10 MG/ML (PF) SYRINGE
PREFILLED_SYRINGE | INTRAVENOUS | Status: AC
  Filled 2022-12-02: qty 10

## 2022-12-02 MED ORDER — ONDANSETRON HCL 4 MG/2ML IJ SOLN
INTRAMUSCULAR | Status: DC | PRN
  Administered 2022-12-02: 4 mg via INTRAVENOUS

## 2022-12-02 MED ORDER — DOCUSATE SODIUM 100 MG PO CAPS
100.0000 mg | ORAL_CAPSULE | Freq: Every day | ORAL | Status: DC
  Administered 2022-12-03: 100 mg via ORAL
  Filled 2022-12-02: qty 1

## 2022-12-02 MED ORDER — POTASSIUM CHLORIDE CRYS ER 20 MEQ PO TBCR: 20.0000 meq | EXTENDED_RELEASE_TABLET | Freq: Every day | ORAL | Status: DC | PRN

## 2022-12-02 MED ORDER — OXYCODONE HCL 5 MG PO TABS
5.0000 mg | ORAL_TABLET | ORAL | Status: DC | PRN
  Administered 2022-12-02 (×2): 5 mg via ORAL
  Administered 2022-12-02 – 2022-12-03 (×2): 10 mg via ORAL
  Administered 2022-12-03: 5 mg via ORAL
  Filled 2022-12-02: qty 1
  Filled 2022-12-02 (×2): qty 2
  Filled 2022-12-02 (×2): qty 1

## 2022-12-02 MED ORDER — HEPARIN SODIUM (PORCINE) 1000 UNIT/ML IJ SOLN
INTRAMUSCULAR | Status: DC | PRN
Start: 2022-12-02 — End: 2022-12-02
  Administered 2022-12-02: 10000 [IU] via INTRAVENOUS
  Administered 2022-12-02: 3000 [IU] via INTRAVENOUS

## 2022-12-02 MED ORDER — ONDANSETRON HCL 4 MG/2ML IJ SOLN
INTRAMUSCULAR | Status: AC
  Filled 2022-12-02: qty 2

## 2022-12-02 MED ORDER — HYDROMORPHONE HCL 1 MG/ML IJ SOLN: 0.5000 mg | INTRAMUSCULAR | Status: DC | PRN

## 2022-12-02 MED ORDER — CLOPIDOGREL BISULFATE 75 MG PO TABS
75.0000 mg | ORAL_TABLET | Freq: Every day | ORAL | Status: DC
  Administered 2022-12-03: 75 mg via ORAL
  Filled 2022-12-02: qty 1

## 2022-12-02 MED ORDER — CHLORHEXIDINE GLUCONATE 0.12 % MT SOLN
OROMUCOSAL | Status: AC
  Administered 2022-12-02: 15 mL via OROMUCOSAL
  Filled 2022-12-02: qty 15

## 2022-12-02 MED ORDER — PHENOL 1.4 % MT LIQD: 1.0000 | OROMUCOSAL | Status: DC | PRN

## 2022-12-02 MED ORDER — GUAIFENESIN-DM 100-10 MG/5ML PO SYRP: 15.0000 mL | ORAL_SOLUTION | ORAL | Status: DC | PRN

## 2022-12-02 MED ORDER — AMISULPRIDE (ANTIEMETIC) 5 MG/2ML IV SOLN: 10.0000 mg | Freq: Once | INTRAVENOUS | Status: DC | PRN

## 2022-12-02 MED ORDER — HEPARIN SODIUM (PORCINE) 5000 UNIT/ML IJ SOLN
5000.0000 [IU] | Freq: Three times a day (TID) | INTRAMUSCULAR | Status: DC
  Administered 2022-12-02 – 2022-12-03 (×2): 5000 [IU] via SUBCUTANEOUS
  Filled 2022-12-02 (×2): qty 1

## 2022-12-02 MED ORDER — CHLORHEXIDINE GLUCONATE 0.12 % MT SOLN: 15.0000 mL | Freq: Once | OROMUCOSAL | Status: AC

## 2022-12-02 MED ORDER — MIDAZOLAM HCL 2 MG/2ML IJ SOLN
INTRAMUSCULAR | Status: AC
  Filled 2022-12-02: qty 2

## 2022-12-02 MED ORDER — ATORVASTATIN CALCIUM 10 MG PO TABS: 20.0000 mg | ORAL_TABLET | ORAL | Status: DC

## 2022-12-02 MED ORDER — CEFAZOLIN SODIUM-DEXTROSE 2-4 GM/100ML-% IV SOLN
2.0000 g | Freq: Three times a day (TID) | INTRAVENOUS | Status: AC
  Administered 2022-12-02 (×2): 2 g via INTRAVENOUS
  Filled 2022-12-02 (×2): qty 100

## 2022-12-02 MED ORDER — ORAL CARE MOUTH RINSE: 15.0000 mL | Freq: Once | OROMUCOSAL | Status: AC

## 2022-12-02 MED ORDER — ASPIRIN 81 MG PO TBEC
81.0000 mg | DELAYED_RELEASE_TABLET | Freq: Every day | ORAL | Status: DC
  Administered 2022-12-03: 81 mg via ORAL
  Filled 2022-12-02: qty 1

## 2022-12-02 MED ORDER — SENNOSIDES-DOCUSATE SODIUM 8.6-50 MG PO TABS: 1.0000 | ORAL_TABLET | Freq: Every evening | ORAL | Status: DC | PRN

## 2022-12-02 MED ORDER — FENTANYL CITRATE (PF) 100 MCG/2ML IJ SOLN
25.0000 ug | INTRAMUSCULAR | Status: DC | PRN
  Administered 2022-12-02 (×2): 50 ug via INTRAVENOUS

## 2022-12-02 MED ORDER — ALUM & MAG HYDROXIDE-SIMETH 200-200-20 MG/5ML PO SUSP: 15.0000 mL | ORAL | Status: DC | PRN

## 2022-12-02 MED ORDER — HEPARIN 6000 UNIT IRRIGATION SOLUTION
Status: AC
Start: 2022-12-02 — End: ?
  Filled 2022-12-02: qty 500

## 2022-12-02 MED ORDER — METOPROLOL TARTRATE 5 MG/5ML IV SOLN: 2.0000 mg | INTRAVENOUS | Status: DC | PRN

## 2022-12-02 MED ORDER — LACTATED RINGERS IV SOLN: INTRAVENOUS | Status: DC

## 2022-12-02 MED ORDER — DEXAMETHASONE SODIUM PHOSPHATE 10 MG/ML IJ SOLN
INTRAMUSCULAR | Status: AC
Start: 2022-12-02 — End: ?
  Filled 2022-12-02: qty 1

## 2022-12-02 MED ORDER — PROPOFOL 10 MG/ML IV BOLUS
INTRAVENOUS | Status: AC
  Filled 2022-12-02: qty 20

## 2022-12-02 MED ORDER — MAGNESIUM SULFATE 2 GM/50ML IV SOLN: 2.0000 g | Freq: Every day | INTRAVENOUS | Status: DC | PRN

## 2022-12-02 MED ORDER — METOPROLOL TARTRATE 25 MG PO TABS
25.0000 mg | ORAL_TABLET | Freq: Every day | ORAL | Status: DC
  Administered 2022-12-02: 25 mg via ORAL
  Filled 2022-12-02: qty 1

## 2022-12-02 MED ORDER — FENTANYL CITRATE (PF) 100 MCG/2ML IJ SOLN
INTRAMUSCULAR | Status: AC
  Filled 2022-12-02: qty 2

## 2022-12-02 MED ORDER — ESMOLOL HCL 100 MG/10ML IV SOLN
INTRAVENOUS | Status: DC | PRN
  Administered 2022-12-02: 20 mg via INTRAVENOUS
  Administered 2022-12-02: 30 mg via INTRAVENOUS

## 2022-12-02 MED ORDER — SODIUM CHLORIDE 0.9 % IV SOLN: INTRAVENOUS | Status: DC

## 2022-12-02 MED ORDER — ONDANSETRON HCL 4 MG/2ML IJ SOLN: 4.0000 mg | Freq: Four times a day (QID) | INTRAMUSCULAR | Status: DC | PRN

## 2022-12-02 MED ORDER — CHLORHEXIDINE GLUCONATE CLOTH 2 % EX PADS: 6.0000 | MEDICATED_PAD | Freq: Once | CUTANEOUS | Status: DC

## 2022-12-02 MED ORDER — PROPOFOL 10 MG/ML IV BOLUS
INTRAVENOUS | Status: DC | PRN
  Administered 2022-12-02: 120 mg via INTRAVENOUS

## 2022-12-02 MED ORDER — MIDAZOLAM HCL 2 MG/2ML IJ SOLN
INTRAMUSCULAR | Status: DC | PRN
  Administered 2022-12-02 (×2): 1 mg via INTRAVENOUS

## 2022-12-02 MED ORDER — FENTANYL CITRATE (PF) 250 MCG/5ML IJ SOLN
INTRAMUSCULAR | Status: AC
  Filled 2022-12-02: qty 5

## 2022-12-02 MED ORDER — LIDOCAINE 2% (20 MG/ML) 5 ML SYRINGE
INTRAMUSCULAR | Status: AC
  Filled 2022-12-02: qty 5

## 2022-12-02 MED ORDER — ROCURONIUM BROMIDE 10 MG/ML (PF) SYRINGE
PREFILLED_SYRINGE | INTRAVENOUS | Status: DC | PRN
  Administered 2022-12-02: 20 mg via INTRAVENOUS
  Administered 2022-12-02: 10 mg via INTRAVENOUS
  Administered 2022-12-02: 60 mg via INTRAVENOUS

## 2022-12-02 MED ORDER — CEFAZOLIN SODIUM-DEXTROSE 2-4 GM/100ML-% IV SOLN
2.0000 g | INTRAVENOUS | Status: AC
  Administered 2022-12-02: 2 g via INTRAVENOUS

## 2022-12-02 MED ORDER — PANTOPRAZOLE SODIUM 40 MG PO TBEC
40.0000 mg | DELAYED_RELEASE_TABLET | Freq: Every day | ORAL | Status: DC
  Administered 2022-12-02 – 2022-12-03 (×2): 40 mg via ORAL
  Filled 2022-12-02 (×2): qty 1

## 2022-12-02 MED ORDER — OXYCODONE HCL 5 MG PO TABS: 5.0000 mg | ORAL_TABLET | Freq: Once | ORAL | Status: AC

## 2022-12-02 MED ORDER — ACETAMINOPHEN 500 MG PO TABS
ORAL_TABLET | ORAL | Status: AC
  Administered 2022-12-02: 1000 mg via ORAL
  Filled 2022-12-02: qty 2

## 2022-12-02 MED ORDER — LABETALOL HCL 5 MG/ML IV SOLN: 10.0000 mg | INTRAVENOUS | Status: DC | PRN

## 2022-12-02 MED ORDER — 0.9 % SODIUM CHLORIDE (POUR BTL) OPTIME
TOPICAL | Status: DC | PRN
  Administered 2022-12-02: 1000 mL

## 2022-12-02 MED ORDER — SODIUM CHLORIDE 0.9 % IV SOLN
500.0000 mL | Freq: Once | INTRAVENOUS | Status: DC | PRN
Start: 2022-12-02 — End: 2022-12-03

## 2022-12-02 MED ORDER — ESMOLOL HCL 100 MG/10ML IV SOLN
INTRAVENOUS | Status: AC
  Filled 2022-12-02: qty 10

## 2022-12-02 MED ORDER — PROTAMINE SULFATE 10 MG/ML IV SOLN
INTRAVENOUS | Status: AC
  Filled 2022-12-02: qty 5

## 2022-12-02 MED ORDER — PHENYLEPHRINE HCL-NACL 20-0.9 MG/250ML-% IV SOLN
INTRAVENOUS | Status: DC | PRN
  Administered 2022-12-02: 60 ug/min via INTRAVENOUS
  Administered 2022-12-02: 160 ug via INTRAVENOUS
  Administered 2022-12-02: 50 ug via INTRAVENOUS
  Administered 2022-12-02: 160 ug via INTRAVENOUS
  Administered 2022-12-02: 80 ug via INTRAVENOUS

## 2022-12-02 MED ORDER — ACETAMINOPHEN 650 MG RE SUPP: 325.0000 mg | RECTAL | Status: DC | PRN

## 2022-12-02 MED ORDER — KETAMINE HCL 10 MG/ML IJ SOLN
INTRAMUSCULAR | Status: DC | PRN
Start: 2022-12-02 — End: 2022-12-02
  Administered 2022-12-02: 10 mg via INTRAVENOUS
  Administered 2022-12-02: 15 mg via INTRAVENOUS
  Administered 2022-12-02: 10 mg via INTRAVENOUS
  Administered 2022-12-02: 15 mg via INTRAVENOUS

## 2022-12-02 MED ORDER — CLEVIDIPINE BUTYRATE 0.5 MG/ML IV EMUL
INTRAVENOUS | Status: AC
  Filled 2022-12-02: qty 50

## 2022-12-02 MED ORDER — HYDRALAZINE HCL 20 MG/ML IJ SOLN: 5.0000 mg | INTRAMUSCULAR | Status: DC | PRN

## 2022-12-02 MED ORDER — KETAMINE HCL 50 MG/5ML IJ SOSY
PREFILLED_SYRINGE | INTRAMUSCULAR | Status: AC
  Filled 2022-12-02: qty 5

## 2022-12-02 MED ORDER — FENTANYL CITRATE (PF) 100 MCG/2ML IJ SOLN
INTRAMUSCULAR | Status: AC
  Administered 2022-12-02: 50 ug via INTRAVENOUS
  Filled 2022-12-02: qty 2

## 2022-12-02 MED ORDER — FENTANYL CITRATE (PF) 250 MCG/5ML IJ SOLN
INTRAMUSCULAR | Status: DC | PRN
  Administered 2022-12-02: 150 ug via INTRAVENOUS
  Administered 2022-12-02 (×2): 50 ug via INTRAVENOUS

## 2022-12-02 MED ORDER — BISACODYL 5 MG PO TBEC: 5.0000 mg | DELAYED_RELEASE_TABLET | Freq: Every day | ORAL | Status: DC | PRN

## 2022-12-02 MED ORDER — HEPARIN 6000 UNIT IRRIGATION SOLUTION
Status: DC | PRN
  Administered 2022-12-02: 1

## 2022-12-02 MED ORDER — HEMOSTATIC AGENTS (NO CHARGE) OPTIME
TOPICAL | Status: DC | PRN
  Administered 2022-12-02 (×2): 1 via TOPICAL

## 2022-12-02 MED ORDER — PROTAMINE SULFATE 10 MG/ML IV SOLN
INTRAVENOUS | Status: DC | PRN
Start: 2022-12-02 — End: 2022-12-02
  Administered 2022-12-02: 50 mg via INTRAVENOUS

## 2022-12-02 MED ORDER — ACETAMINOPHEN 500 MG PO TABS: 1000.0000 mg | ORAL_TABLET | Freq: Once | ORAL | Status: AC

## 2022-12-02 MED ORDER — ACETAMINOPHEN 325 MG PO TABS: 325.0000 mg | ORAL_TABLET | ORAL | Status: DC | PRN

## 2022-12-02 MED ORDER — OXYCODONE HCL 5 MG PO TABS
ORAL_TABLET | ORAL | Status: AC
  Administered 2022-12-02: 5 mg via ORAL
  Filled 2022-12-02: qty 1

## 2022-12-02 MED ORDER — IODIXANOL 320 MG/ML IV SOLN
INTRAVENOUS | Status: DC | PRN
  Administered 2022-12-02: 35 mL via INTRA_ARTERIAL

## 2022-12-02 MED ORDER — CEFAZOLIN SODIUM-DEXTROSE 2-4 GM/100ML-% IV SOLN
INTRAVENOUS | Status: AC
  Filled 2022-12-02: qty 100

## 2022-12-02 MED ORDER — LIDOCAINE 2% (20 MG/ML) 5 ML SYRINGE
INTRAMUSCULAR | Status: DC | PRN
  Administered 2022-12-02: 60 mg via INTRAVENOUS

## 2022-12-02 MED ORDER — CHLORHEXIDINE GLUCONATE CLOTH 2 % EX PADS
6.0000 | MEDICATED_PAD | Freq: Once | CUTANEOUS | Status: DC
  Administered 2022-12-02: 6 via TOPICAL

## 2022-12-02 SURGICAL SUPPLY — 65 items
BAG COUNTER SPONGE SURGICOUNT (BAG) ×3 IMPLANT
BALLN MUSTANG 6X60X75 (BALLOONS) ×3
BALLOON MUSTANG 6X60X75 (BALLOONS) ×1 IMPLANT
CANISTER SUCT 3000ML PPV (MISCELLANEOUS) ×3 IMPLANT
CATH BEACON 5 .035 40 KMP TP (CATHETERS) ×1 IMPLANT
CATH BEACON 5 .035 65 KMP TIP (CATHETERS) IMPLANT
CATH CROSS OVER TEMPO 5F (CATHETERS) ×1 IMPLANT
CATH OMNI FLUSH 5F 65CM (CATHETERS) ×3 IMPLANT
CHLORAPREP W/TINT 26 (MISCELLANEOUS) ×3 IMPLANT
CLIP LIGATING EXTRA MED SLVR (CLIP) ×3 IMPLANT
CLIP LIGATING EXTRA SM BLUE (MISCELLANEOUS) ×3 IMPLANT
CLOSURE MYNX CONTROL 5F (Vascular Products) ×1 IMPLANT
COVER DOME SNAP 22 D (MISCELLANEOUS) ×1 IMPLANT
DERMABOND ADVANCED .7 DNX12 (GAUZE/BANDAGES/DRESSINGS) ×3 IMPLANT
DEVICE ENSNARE 12MMX20MM (VASCULAR PRODUCTS) ×1 IMPLANT
DEVICE TORQUE KENDALL .025-038 (MISCELLANEOUS) ×1 IMPLANT
DRAIN CHANNEL 15F RND FF W/TCR (WOUND CARE) IMPLANT
DRAPE C-ARM 42X72 X-RAY (DRAPES) IMPLANT
ELECT REM PT RETURN 9FT ADLT (ELECTROSURGICAL) ×3
ELECTRODE REM PT RTRN 9FT ADLT (ELECTROSURGICAL) ×3 IMPLANT
EVACUATOR SILICONE 100CC (DRAIN) IMPLANT
GLOVE BIO SURGEON STRL SZ7.5 (GLOVE) ×4 IMPLANT
GLOVE BIOGEL PI IND STRL 7.0 (GLOVE) ×1 IMPLANT
GLOVE BIOGEL PI IND STRL 7.5 (GLOVE) ×1 IMPLANT
GLOVE ECLIPSE 7.0 STRL STRAW (GLOVE) ×1 IMPLANT
GOWN STRL REUS W/ TWL LRG LVL3 (GOWN DISPOSABLE) ×6 IMPLANT
GOWN STRL REUS W/ TWL XL LVL3 (GOWN DISPOSABLE) ×3 IMPLANT
GUIDEWIRE ANGLED .035X150CM (WIRE) ×1 IMPLANT
GUIDEWIRE ANGLED .035X260CM (WIRE) ×1 IMPLANT
HEMOSTAT SNOW SURGICEL 2X4 (HEMOSTASIS) ×1 IMPLANT
KIT BASIN OR (CUSTOM PROCEDURE TRAY) ×3 IMPLANT
KIT ENCORE 26 ADVANTAGE (KITS) ×1 IMPLANT
KIT TURNOVER KIT B (KITS) ×3 IMPLANT
NS IRRIG 1000ML POUR BTL (IV SOLUTION) ×6 IMPLANT
PACK ENDO MINOR (CUSTOM PROCEDURE TRAY) ×3 IMPLANT
PACK PERIPHERAL VASCULAR (CUSTOM PROCEDURE TRAY) ×3 IMPLANT
PAD ARMBOARD 7.5X6 YLW CONV (MISCELLANEOUS) ×6 IMPLANT
PATCH VASC XENOSURE 1X6 (Vascular Products) ×1 IMPLANT
POWDER SURGICEL 3.0 GRAM (HEMOSTASIS) ×1 IMPLANT
SET MICROPUNCTURE 5F STIFF (MISCELLANEOUS) ×1 IMPLANT
SHEATH BRITE TIP 6FR 35CM (SHEATH) ×1 IMPLANT
SHEATH PINNACLE 5F 10CM (SHEATH) ×2 IMPLANT
SHEATH PINNACLE 8F 10CM (SHEATH) ×3 IMPLANT
STENT ELUVIA 7X80X130 (Permanent Stent) ×1 IMPLANT
STOPCOCK MORSE 400PSI 3WAY (MISCELLANEOUS) IMPLANT
SUT ETHILON 3 0 PS 1 (SUTURE) IMPLANT
SUT MNCRL AB 4-0 PS2 18 (SUTURE) ×3 IMPLANT
SUT PROLENE 5 0 C 1 24 (SUTURE) ×10 IMPLANT
SUT PROLENE 6 0 BV (SUTURE) ×3 IMPLANT
SUT VIC AB 2-0 CT1 TAPERPNT 27 (SUTURE) ×6 IMPLANT
SUT VIC AB 3-0 SH 27X BRD (SUTURE) ×6 IMPLANT
SYR 10ML LL (SYRINGE) ×2 IMPLANT
SYR 20CC LL (SYRINGE) ×1 IMPLANT
SYR 30ML LL (SYRINGE) ×1 IMPLANT
SYR MEDRAD MARK V 150ML (SYRINGE) ×3 IMPLANT
TOWEL GREEN STERILE (TOWEL DISPOSABLE) ×6 IMPLANT
TOWEL GREEN STERILE FF (TOWEL DISPOSABLE) ×3 IMPLANT
TRAY FOLEY MTR SLVR 16FR STAT (SET/KITS/TRAYS/PACK) ×3 IMPLANT
TUBING HIGH PRESSURE 120CM (CONNECTOR) ×3 IMPLANT
UNDERPAD 30X36 HEAVY ABSORB (UNDERPADS AND DIAPERS) ×3 IMPLANT
WATER STERILE IRR 1000ML POUR (IV SOLUTION) ×3 IMPLANT
WIRE AMPLATZ SS-J .035X180CM (WIRE) IMPLANT
WIRE AMPLATZ SS-J .035X260CM (WIRE) IMPLANT
WIRE BENTSON .035X145CM (WIRE) ×3 IMPLANT
WIRE TORQFLEX AUST .018X40CM (WIRE) ×1 IMPLANT

## 2022-12-02 NOTE — H&P (Signed)
HPI:   Daniel Pierce is a 72 y.o. male follows up for left lower extremity rest pain which short distance claudication.  Similarly has claudication on the right.  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  Recently was evaluated by his cardiologist Dr. Teofilo Pod who prescribed Pletal but he has not been taking.  He has also not been on aspirin after our discussion at last visit.  He does not have any wounds but states that he does have occasional numbness of the left foot on top of his usual rest pain and claudication.  Patient continues to smoke daily.       Past Medical History:  Diagnosis Date   Arthritis     CHF (congestive heart failure) (HCC)     Dysrhythmia      PVC's   Hypertension     Peripheral arterial disease (HCC)          History reviewed. No pertinent family history.          Past Surgical History:  Procedure Laterality Date   BACK SURGERY       ENDOLYMPHATIC [facility] DECOMPRESSION Left 06/24/2021    Procedure: ENDOLYMPHATIC SAC DECOMPRESSION;  Surgeon: Newman Pies, MD;  Location: [facility];  Service: ENT;  Laterality: Left;   FOOT SURGERY Right     PAROTIDECTOMY Left 06/18/2020    Procedure: PAROTIDECTOMY WITH FACIAL NERVE DISSECTION;  Surgeon: Drema Halon, MD;  Location: [facility] OR;  Service: ENT;  Laterality: Left;          Short Social History:  Social History         Tobacco Use   Smoking status: Every Day      Current packs/day: 1.00      Average packs/day: 1 pack/day for 54.8 years (54.8 ttl pk-yrs)      Types: Cigarettes      Start date: 1970   Smokeless tobacco: Never  Substance Use Topics   Alcohol use: Not Currently      Allergies  No Known Allergies           Current Outpatient Medications  Medication Sig Dispense Refill   acetaminophen (TYLENOL) 650 MG CR tablet Take 650 mg by mouth 2 (two) times daily as needed for pain.       metoprolol tartrate (LOPRESSOR) 25 MG tablet Take 25 mg by mouth daily after supper.        triamterene-hydrochlorothiazide (DYAZIDE) 37.5-25 MG capsule TAKE 1 EACH (1 CAPSULE TOTAL) BY MOUTH DAILY. 84 capsule 2               Current Facility-Administered Medications  Medication Dose Route Frequency Provider Last Rate Last Admin   triamcinolone acetonide (KENALOG-40) injection 40 mg  40 mg Other Once [place], Max T, DPM            Review of Systems  Constitutional:  Constitutional negative. HENT: HENT negative.  Eyes: Eyes negative.  Respiratory: Respiratory negative.  Cardiovascular: Positive for claudication.  GI: Gastrointestinal negative.  Musculoskeletal: Positive for leg pain.  Neurological: Positive for numbness.  Hematologic: Hematologic/lymphatic negative.  Psychiatric: Psychiatric negative.          Objective:   Vitals:   12/02/22 0604 12/02/22 0623  BP: (!) 143/65   Pulse: (!) 57 (!) 44  Resp: 18   Temp: 98.3 F (36.8 C)   SpO2: 94%       Physical Exam HENT:     Head: Normocephalic.  Nose: Nose normal.  Eyes:     Pupils: Pupils are equal, round, and reactive to light.  Cardiovascular:     Pulses:          Femoral pulses are 2+ on the right side and 0 on the left side. Pulmonary:     Effort: Pulmonary effort is normal.  Abdominal:     General: Abdomen is flat.     Palpations: Abdomen is soft.  Musculoskeletal:     Cervical back: Normal range of motion.     Right lower leg: No edema.     Left lower leg: No edema.  Skin:    Capillary Refill: Capillary refill takes more than 3 seconds.  Neurological:     General: No focal deficit present.     Mental Status: He is alert.  Psychiatric:        Mood and Affect: Mood normal.        Data: CTA IMPRESSION: VASCULAR   1. 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 2. Significant right femoropopliteal disease with segmental occlusion of the superficial femoral artery. 3. There appears to be patent 3 vessel runoff to the ankles bilaterally. 4. Diminutive native renal arteries with at least moderate stenoses proximally. 5. 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 6. Mild stenosis of the proximal SMA. 7.  Aortic Atherosclerosis (ICD10-I70.0). 8. Coronary artery atherosclerotic vascular calcifications.   NON-VASCULAR   1. No acute abnormality within the abdomen or pelvis. 2. Bilateral renal cortical atrophy. 3. 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 4. 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 5. Hepatic cirrhosis.        Assessment/Plan:    72 year old male with bilateral lower extremity claudication and left lower extremity rest pain with severely depressed ABIs on the left and no palpable left common femoral pulse with CT scanning evidence of occluded left external iliac artery.  We have discussed proceeding with surgical repair to include left common femoral endarterectomy with possible retrograde stenting and attempted antegrade stenting if necessary.  Worse case scenario would be right to left femorofemoral bypass. We have discussed risks, benefits and alternatives and he agrees to proceed.      Teoman Giraud C. Randie Heinz, MD [facility] Office: [phone] Pager: [phone]

## 2022-12-02 NOTE — Op Note (Signed)
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 Randie Heinz, MD Assistants: Clinton Gallant, PA; Filomena Jungling, MS3 Procedure Performed: 1.  Percutaneous ultrasound-guided access and Mynx device closure right common femoral artery 2.  Aortogram with left lower extremity angiography 3.  Left common femoral endarterectomy including left external iliac artery and left superficial femoral artery with bovine pericardial patch angioplasty 4.  Stent of left external iliac artery with 7 x 80 mm Eluvia postdilated with 6 mm Mustang balloon  Indications: 72 year old male with atherosclerosis of his native arteries with left lower extremity rest pain and also has claudication of the right lower extremity.  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  He is now indicated for left common femoral endarterectomy with possible retrograde, possible antegrade external iliac artery stenting on the left possible need for right to left femoral-femoral bypass.  Have discussed the risk benefits alternatives and he demonstrates good understanding and agrees to proceed.  In experience assistant was necessary to facilitate exposure of the left common femoral artery including performing extensive endarterectomy and patch angioplasty as well as passing wires, catheters and sheaths.  Findings: Common femoral artery initially had no pulse and was thickened although not densely calcified.  We performed extensive endarterectomy and had brisk pulsatile backbleeding from the profunda although there was no inflow from the external iliac artery.  We were able to perform endarterectomy several centimeters  past the common femoral artery into the external iliac artery.  Initially we attempted to pass wires in a retrograde fashion these were in a dissection plane but we were able to cannulate the right common femoral artery and passed the wire in an antegrade direction and snare this and then performed stenting from a retrograde fashion.  At completion there was a very strong pulse in the left common femoral artery with strong profunda femoris signals that had low resistance by Doppler.   Procedure:  The patient was identified in the holding area and taken to the operating room where his play supine operative table general anesthesia was induced.  He was sterilely prepped and draped in both groins in the usual fashion, antibiotics were administered a timeout was called.  Ultrasound was used to identify the left common femoral artery which had no pulsatility and then vertical incision was created we dissected down to the left common femoral artery.  There was no pulse there although there was only minimal calcium.  We dissected under the inguinal ligament divided the crossing vein and encircled the artery with blue vessel loop.  There were 2 side branches encircled with red Vesseloops.  We then encircled the SFA and profunda and divided the profunda crossing vein and the patient was fully heparinized.  I then opened the vessel longitudinally and we did have to clamp the backbleeding from the profunda although there was no significant inflow from the external leg artery.  Extensive endarterectomy was performed and we were able to reach for open of the inguinal ligament and perform endarterectomy of the external iliac artery.  The disease did not appear to track into the profunda and so we transected this right at the profunda takeoff where there was still brisk backbleeding and we pulled several centimeters  of chronic appearing thrombus from the SFA and then there was minimal backbleeding.  This was thoroughly irrigated  with heparinized saline and a bovine pericardial patch was then sewn in place with 5-0 Prolene suture.  Prior completion we thoroughly flushed the common femoral artery and then released our clamps.  There was very minimal pulsatility at this time.  The patch was then cannulated with a micropuncture needle followed by wire and a sheath.  We placed a Bentson wire above by a 5 [place] sheath.  Using a Kumpe catheter and combination of Bentson wire and Glidewire we were unable to cross retrograde without entering multiple dissection planes.  With this ultrasound was used to identify the right common femoral artery which did have some disease posteriorly but this was cannulated with a micropuncture needle followed by wire and a sheath and a Glidewire advantage was placed followed by a 5 [place] sheath.  Using a crossover catheter I was then able to direct a short Glidewire down through the external iliac artery occlusion and into the left common femoral artery.  This was then snared through the sheath and pulled through and through.  Over the wire from the left side I then placed a Kumpe catheter up into the aorta and then removed the Glidewire placed the Glidewire advantage into the aorta after confirming intraluminal access.  We then performed multiple angiographic views and elected for primary stenting.  We exchanged for a Brite tip 6 French sheath placed into the common iliac artery over the Glidewire advantage under direct fluoroscopic vision.  We then primarily stented from just after the takeoff of the hypogastric artery down to 2 cm above the common femoral head and this was performed with a 7 mm Eluvia postdilated with 6 mm balloon.  At the completion of this there was a very strong pulse in the common femoral artery.  Angiography demonstrated patency of the stent with brisk flow down to the common femoral and into the profunda out at least 10 cm.  Satisfied with this the wire was removed.  The right common  femoral access site was closed with a Mynx device deployed for 2 minutes and then pressure held for an additional several minutes until hemostasis was obtained.  In the left groin we placed a U-stitch 5-0 Prolene suture and then removed the 6 French sheath and cinched the stitch.  Again there was very strong pulsatility and this was confirmed with Doppler in the outflow being very strong through the profunda with low resistance signal.  50 mg of protamine was administered and we obtained hemostasis meticulously in the wound and closed in layers of Vicryl and Monocryl.  Dermabond was placed at the skin level.  The patient was then awakened from anesthesia having tolerated the procedure without any complication.  All counts were correct at completion.  EBL: 100 cc  Contrast: 35 cc   Zarinah Oviatt C. Randie Heinz, MD [facility] Office: [phone] Pager: [phone]

## 2022-12-02 NOTE — Plan of Care (Signed)
  Problem: Education: Goal: Knowledge of prescribed regimen will improve Outcome: Progressing   Problem: Activity: Goal: Ability to tolerate increased activity will improve Outcome: Progressing   Problem: Clinical Measurements: Goal: Postoperative complications will be avoided or minimized Outcome: Progressing Goal: Signs and symptoms of graft occlusion will improve Outcome: Progressing   Problem: Skin Integrity: Goal: Demonstration of wound healing without infection will improve Outcome: Progressing

## 2022-12-02 NOTE — Anesthesia Postprocedure Evaluation (Signed)
Anesthesia Post Note  Patient: Daniel Pierce  Procedure(s) Performed: LEFT COMMON FEMORAL ENDARTERECTOMY (Left) INSERTION OF RETROGRADE ILIAC STENT (Left) VEIN AND XENOSURE PATCH ANGIOPLASTY (Left: Groin) ULTRASOUND GUIDANCE (Left: Groin)     Patient location during evaluation: PACU Anesthesia Type: General Level of consciousness: awake and alert Pain management: pain level controlled Vital Signs Assessment: post-procedure vital signs reviewed and stable Respiratory status: spontaneous breathing, nonlabored ventilation, respiratory function stable and patient connected to nasal cannula oxygen Cardiovascular status: blood pressure returned to baseline and stable Postop Assessment: no apparent nausea or vomiting Anesthetic complications: no  No notable events documented.  Last Vitals:  Vitals:   12/02/22 1552 12/02/22 1600  BP:  (!) 134/95  Pulse: 77 89  Resp: 17 16  Temp: 36.6 C   SpO2: 98% 97%    Last Pain:  Vitals:   12/02/22 1552  TempSrc: Oral  PainSc:                  Kennieth Rad

## 2022-12-02 NOTE — Transfer of Care (Signed)
Immediate Anesthesia Transfer of Care Note  Patient: GENERO SUMMERSETT  Procedure(s) Performed: LEFT COMMON FEMORAL ENDARTERECTOMY (Left) INSERTION OF RETROGRADE ILIAC STENT (Left) VEIN AND XENOSURE PATCH ANGIOPLASTY (Left: Groin) ULTRASOUND GUIDANCE (Left: Groin)  Patient Location: PACU  Anesthesia Type:General  Level of Consciousness: oriented and drowsy  Airway & Oxygen Therapy: Patient Spontanous Breathing and Patient connected to nasal cannula oxygen  Post-op Assessment: Report given to RN, Post -op Vital signs reviewed and stable, and Patient moving all extremities  Post vital signs: Reviewed and stable  Last Vitals:  Vitals Value Taken Time  BP 130/84 12/02/22 1037  Temp 97.7   Pulse 74 12/02/22 1044  Resp 16 12/02/22 1044  SpO2 99 % 12/02/22 1044  Vitals shown include unfiled device data.  Last Pain:  Vitals:   12/02/22 0615  TempSrc:   PainSc: 7          Complications: No notable events documented.

## 2022-12-02 NOTE — Anesthesia Procedure Notes (Signed)
Arterial Line Insertion Start/End11/26/2024 7:08 AM, 12/02/2022 7:12 AM Performed by: Ivin Poot, CRNA, CRNA  Patient location: Pre-op. Preanesthetic checklist: patient identified, IV checked, site marked, risks and benefits discussed, surgical consent, monitors and equipment checked, pre-op evaluation, timeout performed and anesthesia consent Lidocaine 1% used for infiltration Right, radial was placed Catheter size: 20 G Hand hygiene performed  and maximum sterile barriers used   Attempts: 1 Procedure performed without using ultrasound guided technique. Following insertion, Biopatch and dressing applied. Post procedure assessment: normal  Patient tolerated the procedure well with no immediate complications.

## 2022-12-03 ENCOUNTER — Other Ambulatory Visit (HOSPITAL_COMMUNITY): Payer: Self-pay

## 2022-12-03 ENCOUNTER — Encounter (HOSPITAL_COMMUNITY): Payer: Self-pay | Admitting: Vascular Surgery

## 2022-12-03 DIAGNOSIS — Z9889 Other specified postprocedural states: Secondary | ICD-10-CM

## 2022-12-03 DIAGNOSIS — Z95828 Presence of other vascular implants and grafts: Secondary | ICD-10-CM

## 2022-12-03 LAB — CBC
HCT: 38.7 % — ABNORMAL LOW (ref 39.0–52.0)
Hemoglobin: 13.2 g/dL (ref 13.0–17.0)
MCH: 32 pg (ref 26.0–34.0)
MCHC: 34.1 g/dL (ref 30.0–36.0)
MCV: 93.9 fL (ref 80.0–100.0)
Platelets: 194 10*3/uL (ref 150–400)
RBC: 4.12 MIL/uL — ABNORMAL LOW (ref 4.22–5.81)
RDW: 12.4 % (ref 11.5–15.5)
WBC: 16.4 10*3/uL — ABNORMAL HIGH (ref 4.0–10.5)
nRBC: 0 % (ref 0.0–0.2)

## 2022-12-03 LAB — BASIC METABOLIC PANEL
Anion gap: 8 (ref 5–15)
BUN: 25 mg/dL — ABNORMAL HIGH (ref 8–23)
CO2: 24 mmol/L (ref 22–32)
Calcium: 9.2 mg/dL (ref 8.9–10.3)
Chloride: 106 mmol/L (ref 98–111)
Creatinine, Ser: 1.65 mg/dL — ABNORMAL HIGH (ref 0.61–1.24)
GFR, Estimated: 44 mL/min — ABNORMAL LOW (ref >60)
Glucose, Bld: 173 mg/dL — ABNORMAL HIGH (ref 70–99)
Potassium: 4 mmol/L (ref 3.5–5.1)
Sodium: 138 mmol/L (ref 135–145)

## 2022-12-03 LAB — LIPID PANEL
Cholesterol: 132 mg/dL (ref 0–200)
HDL: 46 mg/dL (ref >40)
LDL Cholesterol: 68 mg/dL (ref 0–99)
Total CHOL/HDL Ratio: 2.9 {ratio}
Triglycerides: 88 mg/dL (ref <150)
VLDL: 18 mg/dL (ref 0–40)

## 2022-12-03 MED ORDER — TRIAMTERENE-HCTZ 37.5-25 MG PO TABS
1.0000 | ORAL_TABLET | Freq: Every day | ORAL | Status: DC
  Administered 2022-12-03: 1 via ORAL
  Filled 2022-12-03: qty 1

## 2022-12-03 MED ORDER — ATORVASTATIN CALCIUM 40 MG PO TABS
40.0000 mg | ORAL_TABLET | Freq: Every day | ORAL | 3 refills | Status: DC
  Filled 2022-12-03: qty 30, 30d supply, fill #0

## 2022-12-03 MED ORDER — OXYCODONE HCL 5 MG PO TABS
5.0000 mg | ORAL_TABLET | Freq: Four times a day (QID) | ORAL | 0 refills | Status: DC | PRN
  Filled 2022-12-03: qty 20, 5d supply, fill #0

## 2022-12-03 MED ORDER — CLOPIDOGREL BISULFATE 75 MG PO TABS
75.0000 mg | ORAL_TABLET | Freq: Every day | ORAL | 1 refills | Status: AC
  Filled 2022-12-03: qty 30, 30d supply, fill #0

## 2022-12-03 NOTE — Discharge Summary (Signed)
  Discharge Summary  Patient ID: Daniel Pierce [id number] 72 y.o. [birth year]  Admit date: 12/02/2022  Discharge date and time: 12/03/2022 12:05 PM   Admitting Physician: Maeola Harman, MD   Discharge Physician: same  Admission Diagnoses: Atherosclerosis of artery of left lower extremity (HCC) [I70.202] PAD (peripheral artery disease) (HCC) [I73.9]  Discharge Diagnoses: same  Admission Condition: fair  Discharged Condition: fair  Indication for Admission: post operative care  Hospital Course: Mr. Daniel Pierce is a 72 year old male who was brought in as an outpatient and underwent left common femoral endarterectomy with bovine patch angioplasty and stenting of the left external iliac artery by Dr. Randie Heinz on 12/02/2022 due to critical limb ischemia with left lower extremity rest pain.  He tolerated the procedure well and was admitted to the hospital postoperatively.  POD #1 left foot is well-perfused with brisk DP signals by Doppler.  Rest pain has completely resolved.  He will take aspirin and statin daily and will be prescribed Plavix to take daily.  He is ambulating without difficulty and voiding on his own.  He will follow-up in office in 2 to 3 weeks.  He was discharged home in stable condition.  Consults: None  Treatments: surgery: Left common femoral artery endarterectomy with bovine patch angioplasty and stenting of the left external iliac artery by Dr. Randie Heinz on 12/02/2022  Discharge Exam: See progress note 12/03/22 Vitals:   12/03/22 1100 12/03/22 1108  BP:  (!) 143/62  Pulse:  70  Resp: 12 16  Temp:  98.2 F (36.8 C)  SpO2:  98%     Disposition: Discharge disposition: 01-Home or Self Care       Patient Instructions:  Allergies as of 12/03/2022   No Known Allergies      Medication List     TAKE these medications    aspirin EC 81 MG tablet Take 81 mg by mouth daily. Swallow whole.   atorvastatin 40 MG tablet Commonly known as:  LIPITOR Take 1 tablet (40 mg total) by mouth daily. What changed:  medication strength how much to take when to take this   clopidogrel 75 MG tablet Commonly known as: PLAVIX Take 1 tablet (75 mg total) by mouth daily at 6 (six) AM. Start taking on: December 04, 2022   ibuprofen 200 MG tablet Commonly known as: ADVIL Take 200 mg by mouth every 6 (six) hours as needed for moderate pain (pain score 4-6) or mild pain (pain score 1-3).   metoprolol tartrate 25 MG tablet Commonly known as: LOPRESSOR Take 25 mg by mouth at bedtime.   oxyCODONE 5 MG immediate release tablet Commonly known as: Oxy IR/ROXICODONE Take 1 tablet (5 mg total) by mouth every 6 (six) hours as needed for moderate pain (pain score 4-6).   triamterene-hydrochlorothiazide 37.5-25 MG capsule Commonly known as: DYAZIDE TAKE 1 EACH (1 CAPSULE TOTAL) BY MOUTH DAILY.       Activity: activity as tolerated Diet: regular diet Wound Care: keep wound clean and dry  Follow-up with [facility] in 3 weeks.  Signed: Emilie Rutter, PA-C 12/03/2022 12:55 PM [facility] Office: [phone]

## 2022-12-03 NOTE — Plan of Care (Signed)
  Problem: Education: Goal: Knowledge of prescribed regimen will improve Outcome: Progressing   Problem: Clinical Measurements: Goal: Postoperative complications will be avoided or minimized Outcome: Progressing Goal: Signs and symptoms of graft occlusion will improve Outcome: Progressing   Problem: Clinical Measurements: Goal: Signs and symptoms of graft occlusion will improve Outcome: Progressing   Problem: Clinical Measurements: Goal: Cardiovascular complication will be avoided Outcome: Progressing   Problem: Pain Management: Goal: General experience of comfort will improve Outcome: Progressing

## 2022-12-03 NOTE — Discharge Instructions (Signed)
 [facility]  Discharge instructions  Lower Extremity Bypass Surgery  Please refer to the following instruction for your post-procedure care. Your surgeon or physician assistant will discuss any changes with you.  Activity  You are encouraged to walk as much as you can. You can slowly return to normal activities during the month after your surgery. Avoid strenuous activity and heavy lifting until your doctor tells you it's OK. Avoid activities such as vacuuming or swinging a golf club. Do not drive until your doctor give the OK and you are no longer taking prescription pain medications. It is also normal to have difficulty with sleep habits, eating and bowel movement after surgery. These will go away with time.  Bathing/Showering  You may shower after you go home. Do not soak in a bathtub, hot tub, or swim until the incision heals completely.  Incision Care  Clean your incision with mild soap and water. Shower every day. Pat the area dry with a clean towel. You do not need a bandage unless otherwise instructed. Do not apply any ointments or creams to your incision. If you have open wounds you will be instructed how to care for them or a visiting nurse may be arranged for you. If you have staples or sutures along your incision they will be removed at your post-op appointment. You may have skin glue on your incision. Do not peel it off. It will come off on its own in about one week. If you have a great deal of moisture in your groin, use a gauze help keep this area dry.  Diet  Resume your normal diet. There are no special food restrictions following this procedure. A low fat/ low cholesterol diet is recommended for all patients with vascular disease. In order to heal from your surgery, it is CRITICAL to get adequate nutrition. Your body requires vitamins, minerals, and protein. Vegetables are the best source of vitamins and minerals. Vegetables also provide the  perfect balance of protein. Processed food has little nutritional value, so try to avoid this.  Medications  Resume taking all your medications unless your doctor or nurse practitioner tells you not to. If your incision is causing pain, you may take over-the-counter pain relievers such as acetaminophen (Tylenol). If you were prescribed a stronger pain medication, please aware these medication can cause nausea and constipation. Prevent nausea by taking the medication with a snack or meal. Avoid constipation by drinking plenty of fluids and eating foods with high amount of fiber, such as fruits, vegetables, and grains. Take Colase 100 mg (an over-the-counter stool softener) twice a day as needed for constipation. Do not take Tylenol if you are taking prescription pain medications.  Follow Up  Our office will schedule a follow up appointment 2-3 weeks following discharge.  Please call us immediately for any of the following conditions  Severe or worsening pain in your legs or feet while at rest or while walking Increase pain, redness, warmth, or drainage (pus) from your incision site(s) Fever of 101 degree or higher The swelling in your leg with the bypass suddenly worsens and becomes more painful than when you were in the hospital If you have been instructed to feel your graft pulse then you should do so every day. If you can no longer feel this pulse, call the office immediately. Not all patients are given this instruction.  Leg swelling is common after leg bypass surgery.  The swelling should improve over a few months   following surgery. To improve the swelling, you may elevate your legs above the level of your heart while you are sitting or resting. Your surgeon or physician assistant may ask you to apply an ACE wrap or wear compression (TED) stockings to help to reduce swelling.  Reduce your risk of vascular disease  Stop smoking. If you would like help call [facility] at 1-800-QUIT-NOW  (1-800-784-8669) or [place] at 336-586-4000.  Manage your cholesterol Maintain a desired weight Control your diabetes weight Control your diabetes Keep your blood pressure down  If you have any questions, please call the office at [phone]   

## 2022-12-03 NOTE — Progress Notes (Addendum)
  Progress Note    12/03/2022 7:24 AM 1 Day Post-Op  Subjective:  groin incision pain much improved this morning   Vitals:   12/03/22 0711 12/03/22 0715  BP: (!) 120/57   Pulse: 84 79  Resp: 17 15  Temp: 97.9 F (36.6 C)   SpO2: 95% 95%   Physical Exam: Lungs:  non labored Incisions:  L groin c/d/I without firm hematoma Extremities:  L foot warm with brisk DP by doppler Neurologic: A&O  CBC    Component Value Date/Time   WBC 16.4 (H) 12/03/2022 0158   RBC 4.12 (L) 12/03/2022 0158   HGB 13.2 12/03/2022 0158   HCT 38.7 (L) 12/03/2022 0158   PLT 194 12/03/2022 0158   MCV 93.9 12/03/2022 0158   MCH 32.0 12/03/2022 0158   MCHC 34.1 12/03/2022 0158   RDW 12.4 12/03/2022 0158    BMET    Component Value Date/Time   NA 138 12/03/2022 0158   K 4.0 12/03/2022 0158   CL 106 12/03/2022 0158   CO2 24 12/03/2022 0158   GLUCOSE 173 (H) 12/03/2022 0158   BUN 25 (H) 12/03/2022 0158   CREATININE 1.65 (H) 12/03/2022 0158   CALCIUM 9.2 12/03/2022 0158   GFRNONAA 44 (L) 12/03/2022 0158    INR    Component Value Date/Time   INR 1.0 12/01/2022 0947     Intake/Output Summary (Last 24 hours) at 12/03/2022 0724 Last data filed at 12/03/2022 0500 Gross per 24 hour  Intake 2875.11 ml  Output 1025 ml  Net 1850.11 ml     Assessment/Plan:  72 y.o. male is s/p L EIA stenting and CFA endarterectomy and bovine patch  1 Day Post-Op   L foot warm and well perfused by doppler exam Groin incision is well appearing; encouraged patient to keep groin clean and dry Continue Asa, plavix, statin Ok for discharge today; office will arrange follow up in 2-3 weeks    Emilie Rutter, PA-C [facility] [phone] 12/03/2022 7:24 AM  I have independently interviewed and examined patient and agree with PA assessment and plan above.  Left foot is very warm with strong signals today.  Okay for discharge on aspirin and Plavix for at least 3 months given external  iliac artery stent and will also continue statin.  Follow-up in a couple weeks for wound check.  Abigayl Hor C. Randie Heinz, MD [facility] [place] [facility] [place] [facility] Office: [phone] Pager: [phone]

## 2022-12-03 NOTE — Evaluation (Signed)
Physical Therapy Evaluation Patient Details Name: Daniel Pierce MRN: [id number] DOB: 12/10/1950 Today's Date: 12/03/2022  History of Present Illness  Pt is 72 yo presenting to [facility] for scheduled procedure on 11/26 for L common femoral endarterectomy with stent.  PMH: Arthritis, CHF, Dysrhythmia, HTN, PAD.  Clinical Impression  Pt is presenting slightly below baseline level of functioning. Pt currently is Mod I for gait, bed mobility and sit to stand without an AD. Pt is demonstrating antalgic gait for 200 ft with wide BOS but no overt LOB. Pt states in the mornings he has a stiffer gait pattern due to arthritis at baseline. Due to pt current functional status, PLOF, home set up and available assistance no recommended skilled physical therapy services at this time. Pt will be discharge from acute care physical therapy services; please re-consult if further needs arise. Pt tolerated treatment session well.             Equipment Recommendations None recommended by PT     Functional Status Assessment Patient has not had a recent decline in their functional status     Precautions / Restrictions Precautions Precautions: None Restrictions Weight Bearing Restrictions: No      Mobility  Bed Mobility Overal bed mobility: Modified Independent             General bed mobility comments: increased time, has adjustable bed at home.    Transfers Overall transfer level: Modified independent Equipment used: None               General transfer comment: No difficulty, wide BOS    Ambulation/Gait Ambulation/Gait assistance: Modified independent (Device/Increase time) Gait Distance (Feet): 200 Feet Assistive device: None Gait Pattern/deviations: Step-through pattern, Decreased stride length, Decreased stance time - left, Decreased step length - left, Decreased step length - right, Wide base of support, Antalgic Gait velocity: decreased Gait velocity interpretation: 1.31 - 2.62  ft/sec, indicative of limited community ambulator   General Gait Details: frontal plane sway, pt states he walks stiff initially every day due to pain in LB and ankles. Pt has flat foot initial contact with very little rolling at the ankle throughout gait patttern.  Stairs Stairs:  (one small threshold to step up, with sit to stand pt demonstrates ability to step up without difficulty.)            Balance Overall balance assessment: Mild deficits observed, not formally tested           Pertinent Vitals/Pain Pain Assessment Pain Assessment: Faces Faces Pain Scale: Hurts little more Pain Location: L groin Pain Descriptors / Indicators: Guarding, Grimacing Pain Intervention(s): Monitored during session    Home Living Family/patient expects to be discharged to:: Private residence Living Arrangements: Spouse/significant other;Children Available Help at Discharge: Family;Available 24 hours/day Type of Home: House Home Access: Stairs to enter Entrance Stairs-Rails: None (uses door frame if needed) Entrance Stairs-Number of Steps: 1   Home Layout: One level Home Equipment: Agricultural consultant (2 wheels);Grab bars - tub/shower;Shower seat;Other (comment) Additional Comments: adjustable bed    Prior Function Prior Level of Function : Independent/Modified Independent;Working/employed;Driving             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 ADLs Comments: ind     Extremity/Trunk Assessment   Upper Extremity Assessment Upper Extremity Assessment: Overall WFL for tasks assessed    Lower Extremity Assessment Lower Extremity Assessment: Overall WFL for tasks assessed    Cervical / Trunk Assessment Cervical /  Trunk Assessment: Normal  Communication   Communication Communication: No apparent difficulties  Cognition Arousal: Alert Behavior During Therapy: WFL for tasks assessed/performed Overall Cognitive Status: Within Functional Limits for  tasks assessed        General Comments General comments (skin integrity, edema, etc.): Incision was dry and no exudate noted. Spouse present throughout session        Assessment/Plan    PT Assessment Patient does not need any further PT services         PT Goals (Current goals can be found in the Care Plan section)  Acute Rehab PT Goals PT Goal Formulation: All assessment and education complete, DC therapy     AM-PAC PT "6 Clicks" Mobility  Outcome Measure Help needed turning from your back to your side while in a flat bed without using bedrails?: None Help needed moving from lying on your back to sitting on the side of a flat bed without using bedrails?: None Help needed moving to and from a bed to a chair (including a wheelchair)?: None Help needed standing up from a chair using your arms (e.g., wheelchair or bedside chair)?: None Help needed to walk in hospital room?: None Help needed climbing 3-5 steps with a railing? : None 6 Click Score: 24    End of Session Equipment Utilized During Treatment: Gait belt Activity Tolerance: Patient tolerated treatment well Patient left: in chair;with call bell/phone within reach;with family/visitor present Nurse Communication: Mobility status      Time: 1610-9604 PT Time Calculation (min) (ACUTE ONLY): 15 min   Charges:   PT Evaluation $PT Eval Low Complexity: 1 Low   PT General Charges $$ ACUTE PT VISIT: 1 Visit        Harrel Carina, DPT, CLT  Acute Rehabilitation Services Office: [phone] (Secure chat preferred)   Claudia Desanctis 12/03/2022, 9:29 AM

## 2022-12-03 NOTE — Evaluation (Signed)
Occupational Therapy Evaluation Patient Details Name: Daniel Pierce MRN: [id number] DOB: 30-Jan-1950 Today's Date: 12/03/2022   History of Present Illness Pt is 72 yo presenting to [facility] for scheduled procedure on 11/26 for L common femoral endarterectomy with stent.  PMH: Arthritis, CHF, Dysrhythmia, HTN, PAD.   Clinical Impression   At baseline, pt is Independent to Mod I with ADLs, IADLs, and functional mobility without an AD. Pt works and drives. Pt now presents with mild deficits noted in dynamic standing balance and ability to complete LB dressing/bathing due to pain at incision site with pt largely completing ADLs Mod I to Supervision for safety due to pain and requiring assist to wash/dry feet and donn/doff socks and shoes. Pt within 95% or baseline and surgical pain expected to improve quickly. Pt reports he is comfortable with his wife assisting with LB dressing/bathing and with IADLs as needed during his recovery. Pt declines training in AE for LB ADLs at this time. No further benefit from acute skilled OT services at this time and no post acute OT follow up indicated. OT signing off.       If plan is discharge home, recommend the following: A little help with bathing/dressing/bathroom;Assistance with cooking/housework;Help with stairs or ramp for entrance;Assist for transportation (PRN assist)    Functional Status Assessment  Patient has had a recent decline in their functional status and demonstrates the ability to make significant improvements in function in a reasonable and predictable amount of time.  Equipment Recommendations  None recommended by OT (Pt already has needed equipment.)    Recommendations for Other Services       Precautions / Restrictions Precautions Precautions: None Restrictions Weight Bearing Restrictions: No      Mobility Bed Mobility Overal bed mobility: Modified Independent             General bed mobility comments: increased time, has  adjustable bed at home.    Transfers Overall transfer level: Modified independent Equipment used: None               General transfer comment: No difficulty, wide BOS      Balance Overall balance assessment: Mild deficits observed, not formally tested                                         ADL either performed or assessed with clinical judgement   ADL Overall ADL's : Modified independent;Needs assistance/impaired Eating/Feeding: Independent;Sitting   Grooming: Modified independent;Supervision/safety;Standing Grooming Details (indicate cue type and reason): Mod I to Supervision for safety due to pain. No physical assist needed. Upper Body Bathing: Independent;Sitting   Lower Body Bathing: Contact guard assist;Sitting/lateral leans;Sit to/from stand;Modified independent Lower Body Bathing Details (indicate cue type and reason): Assist for washing/drying B feet due to pain, otherwise Mod I Upper Body Dressing : Independent;Sitting   Lower Body Dressing: Supervision/safety;Contact guard assist;Sitting/lateral leans;Sit to/from stand Lower Body Dressing Details (indicate cue type and reason): Assist for donning/doffinf socks due to pain; otherwise, Supervision for safety due to pain Toilet Transfer: Modified Independent   Toileting- Clothing Manipulation and Hygiene: Modified independent;Sit to/from stand   Tub/ Shower Transfer: Modified independent;Supervision/safety;Shower seat;Grab Investment banker, operational Details (indicate cue type and reason): Mod I to Supervision for safety due to pain. No physical assist needed. Functional mobility during ADLs: Modified independent (no AD) General ADL Comments: Pt with 95% of baseline PLOF with mild deficits  noted due to pain at L groin incision site.     Vision Baseline Vision/History: 1 Wears glasses (contacts, readers) Ability to See in Adequate Light: 0 Adequate (with contacts and readers) Patient Visual  Report: No change from baseline       Perception         Praxis         Pertinent Vitals/Pain Pain Assessment Pain Assessment: Faces Faces Pain Scale: Hurts little more Pain Location: L groin Pain Descriptors / Indicators: Guarding, Grimacing Pain Intervention(s): Monitored during session     Extremity/Trunk Assessment Upper Extremity Assessment Upper Extremity Assessment: Right hand dominant;Overall [facility] for tasks assessed   Lower Extremity Assessment Lower Extremity Assessment: Defer to PT evaluation   Cervical / Trunk Assessment Cervical / Trunk Assessment: Normal   Communication Communication Communication: No apparent difficulties   Cognition Arousal: Alert Behavior During Therapy: WFL for tasks assessed/performed Overall Cognitive Status: Within Functional Limits for tasks assessed                                       General Comments  Incision was dry and no exudate noted.    Exercises     Shoulder Instructions      Home Living Family/patient expects to be discharged to:: Private residence Living Arrangements: Spouse/significant other;Children Available Help at Discharge: Family;Available 24 hours/day Type of Home: House Home Access: Stairs to enter [employer] of Steps: 1 Entrance Stairs-Rails: None (uses door frame if needed) Home Layout: One level     Bathroom Shower/Tub: Chief Strategy Officer: Standard Bathroom Accessibility: Yes   Home Equipment: Agricultural consultant (2 wheels);Grab bars - tub/shower;Shower seat;Other (comment)   Additional Comments: adjustable bed      Prior Functioning/Environment Prior Level of Function : Independent/Modified Independent;Working/employed;Driving             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 ADLs Comments: ind to Mod I due topt reported  chronic low back pain        OT Problem List:        OT Treatment/Interventions:       OT Goals(Current goals can be found in the care plan section) Acute Rehab OT Goals Patient Stated Goal: to return home and return to work  OT Frequency:      Co-evaluation              AM-PAC OT "6 Clicks" Daily Activity     Outcome Measure Help from another person eating meals?: None Help from another person taking care of personal grooming?: None Help from another person toileting, which includes using toliet, bedpan, or urinal?: None Help from another person bathing (including washing, rinsing, drying)?: A Little Help from another person to put on and taking off regular upper body clothing?: None Help from another person to put on and taking off regular lower body clothing?: A Little 6 Click Score: 22   End of Session Nurse Communication: Mobility status;Other (comment) (OT signing off.)  Activity Tolerance: Patient tolerated treatment well Patient left: in chair;with call bell/phone within reach  OT Visit Diagnosis: Other abnormalities of gait and mobility (R26.89);Pain                Time: 9604-5409 OT Time Calculation (min): 11 min Charges:  OT General Charges $OT Visit: 1 Visit OT Evaluation $OT Eval Low Complexity: 1 Low  Iviana Blasingame "Orson Eva., OTR/L, MA Acute Rehab [phone]   Lendon Colonel 12/03/2022, 9:57 AM

## 2022-12-03 NOTE — Progress Notes (Signed)
PHARMACIST LIPID MONITORING   Daniel Pierce is a 72 y.o. male admitted on 12/02/2022 with PVD.  Pharmacy has been consulted to optimize lipid-lowering therapy with the indication of secondary prevention for clinical ASCVD.  Recent Labs:  Lipid Panel (last 6 months):   Lab Results  Component Value Date   CHOL 132 12/03/2022   TRIG 88 12/03/2022   HDL 46 12/03/2022   CHOLHDL 2.9 12/03/2022   VLDL 18 12/03/2022   LDLCALC 68 12/03/2022    Hepatic function panel (last 6 months):   Lab Results  Component Value Date   AST 23 12/01/2022   ALT 28 12/01/2022   ALKPHOS 61 12/01/2022   BILITOT 0.9 12/01/2022    SCr (since admission):   Serum creatinine: 1.65 mg/dL (H) 24/40/10 2725 Estimated creatinine clearance: 46.4 mL/min (A)  Current therapy and lipid therapy tolerance Current lipid-lowering therapy: atorvastatin 20mg  twice per week Documented or reported allergies or intolerances to lipid-lowering therapies (if applicable): none  Assessment:   Patient agrees with changes to lipid-lowering therapy  Plan:    1.Statin intensity (high intensity recommended for all patients regardless of the LDL):  Add or increase statin to high intensity.  2.Add ezetimibe (if any one of the following):   Not indicated at this time.  3.Refer to lipid clinic:   No  4.Follow-up with:  PCP  5.Follow-up labs after discharge:  Changes in lipid therapy were made. Check a lipid panel in 8-12 weeks then annually.      Harland German, PharmD Clinical Pharmacist **Pharmacist phone directory can now be found on [email or web address] ([email or web address]).  Listed under [facility].

## 2022-12-08 LAB — POCT ACTIVATED CLOTTING TIME
Activated Clotting Time: 239 s
Activated Clotting Time: 262 s

## 2022-12-16 ENCOUNTER — Encounter: Payer: Self-pay | Admitting: Podiatry

## 2022-12-16 ENCOUNTER — Ambulatory Visit (INDEPENDENT_AMBULATORY_CARE_PROVIDER_SITE_OTHER): Payer: Medicare Other | Admitting: Podiatry

## 2022-12-16 DIAGNOSIS — D2371 Other benign neoplasm of skin of right lower limb, including hip: Secondary | ICD-10-CM | POA: Diagnosis not present

## 2022-12-16 NOTE — Progress Notes (Signed)
He presents today after having been revascularized by Dr. Henry Russel.  He states that the spot on the bottom of the foot is really give him if his he is referring to the plantar aspect of the fourth metatarsal head of the right foot.  Objective: Pulses are palpable.  There is plantarflexed fourth metatarsal resulting in a benign skin lesion.  Assessment: Benign skin lesion.  Plan: Debrided benign skin lesion follow-up with him as needed placed padding today.

## 2022-12-24 ENCOUNTER — Ambulatory Visit (INDEPENDENT_AMBULATORY_CARE_PROVIDER_SITE_OTHER): Payer: Medicare Other | Admitting: Physician Assistant

## 2022-12-24 VITALS — BP 110/72 | HR 57 | Temp 97.9°F | Resp 20 | Ht 73.0 in | Wt 205.9 lb

## 2022-12-24 DIAGNOSIS — I70222 Atherosclerosis of native arteries of extremities with rest pain, left leg: Secondary | ICD-10-CM

## 2022-12-24 NOTE — Progress Notes (Signed)
  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  s/p open  Left common femoral endarterectomy including left external iliac artery and left superficial femoral artery with bovine pericardial patch angioplasty with Stent of left external iliac artery on 12/02/22 by Dr. Randie Heinz.    Pt returns today for follow up.  Pt states he can walk now without claudication.  He has numbness in the anterior ankle and thigh.  He also feels that the thigh muscle is weaker because of pain in the groin from the incision.  There is minimal ss drainage from the distal incision.  He denies fever and chills.    He continues to be an every day tobacco user.  He is taking Lipitor, ASA and Plavix daily.   No Known Allergies  Current Outpatient Medications  Medication Sig Dispense Refill   aspirin EC 81 MG tablet Take 81 mg by mouth daily. Swallow whole.     atorvastatin (LIPITOR) 40 MG tablet Take 1 tablet (40 mg total) by mouth daily. 90 tablet 3   clopidogrel (PLAVIX) 75 MG tablet Take 1 tablet (75 mg total) by mouth daily at 6 (six) AM. 30 tablet 1   ibuprofen (ADVIL) 200 MG tablet Take 200 mg by mouth every 6 (six) hours as needed for moderate pain (pain score 4-6) or mild pain (pain score 1-3).     metoprolol tartrate (LOPRESSOR) 25 MG tablet Take 25 mg by mouth at bedtime.     oxyCODONE (OXY IR/ROXICODONE) 5 MG immediate release tablet Take 1 tablet (5 mg total) by mouth every 6 (six) hours as needed for moderate pain (pain score 4-6). 20 tablet 0   triamterene-hydrochlorothiazide (DYAZIDE) 37.5-25 MG capsule TAKE 1 EACH (1 CAPSULE TOTAL) BY MOUTH DAILY. 84  capsule 2   Current Facility-Administered Medications  Medication Dose Route Frequency Provider Last Rate Last Admin   triamcinolone acetonide (KENALOG-40) injection 40 mg  40 mg Other Once Hyatt, Max T, DPM         ROS:  See HPI  Physical Exam:    Incision:  healing with pin point drainage distal end of incision.  No frank appearance of infection, no purulent drainage and no expressible drainage in office today.  Extremities:  Doppler signals PT/DP intact, medial, anterior thigh petechial rash without or itching Neuro: intact and equal B LE     Assessment/Plan:  This is a 72 y.o. male who is s/p: s/p open  Left common femoral endarterectomy including left external iliac artery and left superficial femoral artery with bovine pericardial patch angioplasty with Stent of left external iliac artery on 12/02/22 by Dr. Randie Heinz.     -We will start dry dressing daily top groin incision.  Continue activity as tolerates.  He states the numbness has been there since prior to surgery Keep an eye on the petechial rash, hopefully it will go away with time. I will schedule him for f/u to check the incision and baseline studies of the iliac stent and ABI's.    Mosetta Pigeon PA-C [facility] [phone]   Clinic MD:  Randie Heinz

## 2023-01-02 ENCOUNTER — Other Ambulatory Visit (HOSPITAL_COMMUNITY): Payer: Self-pay

## 2023-01-02 ENCOUNTER — Other Ambulatory Visit: Payer: Self-pay

## 2023-01-02 DIAGNOSIS — I70222 Atherosclerosis of native arteries of extremities with rest pain, left leg: Secondary | ICD-10-CM

## 2023-02-02 ENCOUNTER — Ambulatory Visit (INDEPENDENT_AMBULATORY_CARE_PROVIDER_SITE_OTHER): Payer: Medicare Other | Admitting: Physician Assistant

## 2023-02-02 ENCOUNTER — Ambulatory Visit (HOSPITAL_COMMUNITY)
Admission: RE | Admit: 2023-02-02 | Discharge: 2023-02-02 | Disposition: A | Discharge status: Home / Self Care | Payer: Medicare Other | Source: Ambulatory Visit | Attending: Vascular Surgery | Admitting: Vascular Surgery

## 2023-02-02 ENCOUNTER — Ambulatory Visit (INDEPENDENT_AMBULATORY_CARE_PROVIDER_SITE_OTHER)
Admission: RE | Admit: 2023-02-02 | Discharge: 2023-02-02 | Disposition: A | Discharge status: Home / Self Care | Payer: Medicare Other | Source: Ambulatory Visit | Attending: Vascular Surgery

## 2023-02-02 VITALS — BP 137/62 | HR 35 | Temp 97.2°F | Resp 20 | Ht 73.0 in | Wt 206.1 lb

## 2023-02-02 DIAGNOSIS — I739 Peripheral vascular disease, unspecified: Secondary | ICD-10-CM

## 2023-02-02 DIAGNOSIS — I70222 Atherosclerosis of native arteries of extremities with rest pain, left leg: Secondary | ICD-10-CM

## 2023-02-02 LAB — VAS US ABI WITH/WO TBI
Left ABI: 0.62
Right ABI: 0.58

## 2023-02-02 NOTE — Progress Notes (Signed)
POST OPERATIVE OFFICE NOTE    CC:  F/u for surgery  HPI: Daniel Pierce is a 73 y.o. male who is here for follow up.  He recently underwent left common femoral, left external iliac artery, and left superficial femoral endarterectomy and left external iliac artery stenting on 12/02/2022 by Dr. Randie Heinz.  This was done for left lower extremity rest pain and claudication.  At his first follow-up with our office he was doing fairly well.  He was walking without claudication.  His incisions were healing appropriately without signs of infection.  He returns today for repeat studies.  He still denies any return of claudication or rest pain.  His incision has completely healed. He still feels some soreness in his left thigh that comes and goes.   No Known Allergies  Current Outpatient Medications  Medication Sig Dispense Refill   aspirin EC 81 MG tablet Take 81 mg by mouth daily. Swallow whole.     atorvastatin (LIPITOR) 40 MG tablet Take 1 tablet (40 mg total) by mouth daily. 90 tablet 3   clopidogrel (PLAVIX) 75 MG tablet Take 1 tablet (75 mg total) by mouth daily at 6 (six) AM. 30 tablet 1   ibuprofen (ADVIL) 200 MG tablet Take 200 mg by mouth every 6 (six) hours as needed for moderate pain (pain score 4-6) or mild pain (pain score 1-3).     metoprolol tartrate (LOPRESSOR) 25 MG tablet Take 25 mg by mouth at bedtime.     oxyCODONE (OXY IR/ROXICODONE) 5 MG immediate release tablet Take 1 tablet (5 mg total) by mouth every 6 (six) hours as needed for moderate pain (pain score 4-6). 20 tablet 0   triamterene-hydrochlorothiazide (DYAZIDE) 37.5-25 MG capsule TAKE 1 EACH (1 CAPSULE TOTAL) BY MOUTH DAILY. 84 capsule 2   Current Facility-Administered Medications  Medication Dose Route Frequency Provider Last Rate Last Admin   triamcinolone acetonide (KENALOG-40) injection 40 mg  40 mg Other Once Hyatt, Max T, DPM         ROS:  See HPI  Physical Exam:  Incision: Left groin incision well-healed without  erythema or dehiscence Extremities: Brisk left DP/PT Doppler signals Neuro: Intact motor and sensation of left lower extremity  Studies:  Left Aorta/IVC/Iliac Duplex (02/02/2023) Patent left external iliac artery stent without stenosis  ABIs (02/02/2023) +-------+-----------+-----------+------------+------------+  ABI/TBIToday's ABIToday's TBIPrevious ABIPrevious TBI  +-------+-----------+-----------+------------+------------+  Right 0.58       0.32       0.58        0.34          +-------+-----------+-----------+------------+------------+  Left  0.62       0.50       0.34        0.20          +-------+-----------+-----------+------------+------------+    Assessment/Plan:  This is a 73 y.o. male who is here for follow up  -The patient recently underwent extensive left femoral endarterectomy with left external iliac artery stenting for claudication and rest pain.  His symptoms have resolved since surgery -His left groin incision is now completely healed.  There are no signs of infection such as erythema or drainage -His left lower extremity is warm and well-perfused with monophasic DP/PT Doppler signals -ABIs demonstrate greatly improved blood flow in the left, at 0.62.  Duplex demonstrates a patent left external iliac artery stent without stenosis -He just recently ran out of his Plavix.  He states this caused a lot of easy bruising.  At this point he  can continue with aspirin only.  I have encouraged him to also continue his statin -He can follow-up with our office in 6 months with repeat ABIs and left aortoiliac duplex   [name], PA-C [facility] [phone]   Clinic MD:  Myra Gianotti

## 2023-02-17 ENCOUNTER — Other Ambulatory Visit: Payer: Self-pay

## 2023-02-17 DIAGNOSIS — I70222 Atherosclerosis of native arteries of extremities with rest pain, left leg: Secondary | ICD-10-CM

## 2023-02-17 DIAGNOSIS — I739 Peripheral vascular disease, unspecified: Secondary | ICD-10-CM

## 2023-03-14 ENCOUNTER — Other Ambulatory Visit (HOSPITAL_COMMUNITY): Payer: Self-pay

## 2023-05-04 ENCOUNTER — Ambulatory Visit (INDEPENDENT_AMBULATORY_CARE_PROVIDER_SITE_OTHER): Payer: Medicare Other

## 2023-05-04 ENCOUNTER — Encounter (INDEPENDENT_AMBULATORY_CARE_PROVIDER_SITE_OTHER): Payer: Self-pay

## 2023-05-04 VITALS — BP 124/78 | HR 58 | Ht 73.0 in | Wt 205.0 lb

## 2023-05-04 DIAGNOSIS — R42 Dizziness and giddiness: Secondary | ICD-10-CM

## 2023-05-04 DIAGNOSIS — H9042 Sensorineural hearing loss, unilateral, left ear, with unrestricted hearing on the contralateral side: Secondary | ICD-10-CM | POA: Diagnosis not present

## 2023-05-04 DIAGNOSIS — H8102 Meniere's disease, left ear: Secondary | ICD-10-CM | POA: Diagnosis not present

## 2023-05-05 NOTE — Progress Notes (Signed)
 Patient ID: Daniel Pierce, male   DOB: [birth year], 73 y.o.   MRN: [id number]  Follow-up: Recurrent dizziness, left ear Mnire's disease, left ear hearing loss  HPI: The patient is a 73 year old male who returns today for his follow-up evaluation.  The patient has a history of recurrent dizziness and left ear hearing loss.  He was treated with low-salt diet, Dyazide  diuretic, and left endolymphatic sac decompression surgery.  The patient returns today reporting no significant dizziness over the past 6 months.  He continues to have hearing difficulty in the left ear.  However, he has not noted any recent change in his hearing level.  Currently he denies any otalgia, otorrhea, or vertigo.  Exam: General: Communicates without difficulty, well nourished, no acute distress. Head: Normocephalic, no evidence injury, no tenderness, facial buttresses intact without stepoff. Face/sinus: No tenderness to palpation and percussion. Facial movement is normal and symmetric. Eyes: PERRL, EOMI. No scleral icterus, conjunctivae clear. Neuro: CN II exam reveals vision grossly intact.  No nystagmus at any point of gaze. Ears: Auricles well formed without lesions.  Ear canals are intact without mass or lesion.  No erythema or edema is appreciated.  The TMs are intact without fluid. Nose: External evaluation reveals normal support and skin without lesions.  Dorsum is intact.  Anterior rhinoscopy reveals congested mucosa over anterior aspect of inferior turbinates and intact septum.  No purulence noted. Oral:  Oral cavity and oropharynx are intact, symmetric, without erythema or edema.  Mucosa is moist without lesions. Neck: Full range of motion without pain.  There is no significant lymphadenopathy.  No masses palpable.  Thyroid bed within normal limits to palpation.  Parotid glands and submandibular glands equal bilaterally without mass.  Trachea is midline. Neuro:  CN 2-12 grossly intact.   Assessment: 1.  The patient's  recurrent dizziness and left ear Mnire's disease are currently under control with the low-salt diet and Dyazide  diuretic.  He previously underwent endolymphatic sac decompression surgery in 2023. 2.  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  Plan: 1.  The physical exam findings are reviewed with the patient. 2.  Continue with his 1500 mg low-salt diet and Dyazide  diuretic. 3.  The patient is a candidate for hearing amplification. 4.  The patient will return for reevaluation in 6 months.

## 2023-05-06 ENCOUNTER — Ambulatory Visit: Payer: Medicare Other

## 2023-05-06 ENCOUNTER — Encounter (HOSPITAL_COMMUNITY): Payer: Medicare Other

## 2023-06-26 ENCOUNTER — Other Ambulatory Visit (INDEPENDENT_AMBULATORY_CARE_PROVIDER_SITE_OTHER): Payer: Self-pay | Admitting: Otolaryngology

## 2023-06-26 MED ORDER — TRIAMTERENE-HCTZ 37.5-25 MG PO CAPS: 1.0000 | ORAL_CAPSULE | Freq: Every day | ORAL | 3 refills | Status: AC

## 2023-07-28 ENCOUNTER — Ambulatory Visit: Admitting: Podiatry

## 2023-08-05 ENCOUNTER — Ambulatory Visit (HOSPITAL_COMMUNITY)
Admission: RE | Admit: 2023-08-05 | Discharge: 2023-08-05 | Discharge status: Home / Self Care | Payer: Medicare Other | Source: Ambulatory Visit | Attending: Vascular Surgery

## 2023-08-05 ENCOUNTER — Ambulatory Visit (INDEPENDENT_AMBULATORY_CARE_PROVIDER_SITE_OTHER): Payer: Medicare Other | Admitting: Physician Assistant

## 2023-08-05 ENCOUNTER — Ambulatory Visit (HOSPITAL_COMMUNITY)
Admission: RE | Admit: 2023-08-05 | Discharge: 2023-08-05 | Disposition: A | Discharge status: Home / Self Care | Payer: Medicare Other | Source: Ambulatory Visit | Attending: Vascular Surgery | Admitting: Vascular Surgery

## 2023-08-05 VITALS — BP 119/64 | HR 32 | Temp 97.9°F | Wt 212.8 lb

## 2023-08-05 DIAGNOSIS — I739 Peripheral vascular disease, unspecified: Secondary | ICD-10-CM | POA: Insufficient documentation

## 2023-08-05 DIAGNOSIS — I70222 Atherosclerosis of native arteries of extremities with rest pain, left leg: Secondary | ICD-10-CM | POA: Insufficient documentation

## 2023-08-05 LAB — VAS US ABI WITH/WO TBI
Left ABI: 0.61
Right ABI: 0.56

## 2023-08-05 NOTE — Progress Notes (Signed)
 Office Note   History of Present Illness   Daniel Pierce is a 73 y.o. (1950-07-11) male who presents for surveillance of PAD. He has a history of left common femoral, left external iliac artery, and left superficial femoral endarterectomy and left external iliac artery stenting on 12/02/2022 by Dr. Sheree. This was done for left lower extremity rest pain and claudication, which has resolved since surgery.   He returns today for follow-up.  He says that he is doing well without any issues.  He denies any rest pain, claudication, or tissue loss.  He endorses intermittent numbness in his left foot when he turns his toes inward.  He also has ongoing, chronic lower back pain.  Current Outpatient Medications  Medication Sig Dispense Refill   triamterene -hydrochlorothiazide  (DYAZIDE ) 37.5-25 MG capsule Take 1 each (1 capsule total) by mouth daily. 90 capsule 3   aspirin  EC 81 MG tablet Take 81 mg by mouth daily. Swallow whole.     atorvastatin  (LIPITOR) 40 MG tablet Take 1 tablet (40 mg total) by mouth daily. 90 tablet 3   clopidogrel  (PLAVIX ) 75 MG tablet Take 1 tablet (75 mg total) by mouth daily at 6 (six) AM. (Patient not taking: Reported on 08/05/2023) 30 tablet 1   ibuprofen (ADVIL) 200 MG tablet Take 200 mg by mouth every 6 (six) hours as needed for moderate pain (pain score 4-6) or mild pain (pain score 1-3).     metoprolol  tartrate (LOPRESSOR ) 25 MG tablet Take 25 mg by mouth at bedtime.     oxyCODONE  (OXY IR/ROXICODONE ) 5 MG immediate release tablet Take 1 tablet (5 mg total) by mouth every 6 (six) hours as needed for moderate pain (pain score 4-6). 20 tablet 0   triamterene -hydrochlorothiazide  (DYAZIDE ) 37.5-25 MG capsule TAKE 1 EACH (1 CAPSULE TOTAL) BY MOUTH DAILY. 84 capsule 2   Current Facility-Administered Medications  Medication Dose Route Frequency Provider Last Rate Last Admin   triamcinolone  acetonide (KENALOG -40) injection 40 mg  40 mg Other Once Viola, Max T, DPM         REVIEW OF SYSTEMS (negative unless checked):   Cardiac:  []  Chest pain or chest pressure? []  Shortness of breath upon activity? []  Shortness of breath when lying flat? []  Irregular heart rhythm?  Vascular:  []  Pain in calf, thigh, or hip brought on by walking? []  Pain in feet at night that wakes you up from your sleep? []  Blood clot in your veins? []  Leg swelling?  Pulmonary:  []  Oxygen at home? []  Productive cough? []  Wheezing?  Neurologic:  []  Sudden weakness in arms or legs? []  Sudden numbness in arms or legs? []  Sudden onset of difficult speaking or slurred speech? []  Temporary loss of vision in one eye? []  Problems with dizziness?  Gastrointestinal:  []  Blood in stool? []  Vomited blood?  Genitourinary:  []  Burning when urinating? []  Blood in urine?  Psychiatric:  []  Major depression  Hematologic:  []  Bleeding problems? []  Problems with blood clotting?  Dermatologic:  []  Rashes or ulcers?  Constitutional:  []  Fever or chills?  Ear/Nose/Throat:  []  Change in hearing? []  Nose bleeds? []  Sore throat?  Musculoskeletal:  []  Back pain? []  Joint pain? []  Muscle pain?   Physical Examination   Vitals:   08/05/23 0911  BP: 119/64  Pulse: (!) 32  Temp: 97.9 F (36.6 C)  TempSrc: Temporal  Weight: 212 lb 12.8 oz (96.5 kg)   Body mass index is 28.08 kg/m.  General:  WDWN  in NAD; vital signs documented above Gait: Not observed HENT: WNL, normocephalic Pulmonary: normal non-labored breathing , without rales, rhonchi,  wheezing Cardiac: Regular Abdomen: soft, NT, no masses Skin: without rashes Vascular Exam/Pulses: Brisk PT Doppler signals bilaterally Extremities: without ischemic changes, without gangrene , without cellulitis; without open wounds;  Musculoskeletal: no muscle wasting or atrophy  Neurologic: A&O X 3;  No focal weakness or paresthesias are detected Psychiatric:  The pt has Normal affect.  Non-Invasive Vascular imaging    ABI (08/05/2023) R:  ABI: 0.56 (0.58),  PT: mono DP: mono TBI: 0.27 L:  ABI: 0.61 (0.62),  PT: mono DP: mono TBI: 0.31  Aortoiliac Duplex (08/05/2023) Patent left external iliac artery stent without stenosis   Medical Decision Making   Daniel Pierce is a 73 y.o. male who presents for surveillance of PAD  Based on the patient's vascular studies, his ABIs are essentially unchanged from his last visit.  His right ABI is 0.56 and left ABI is 0.61 Aortoiliac duplex demonstrates a patent left external iliac artery stent without stenosis He has palpable femoral pulses on exam.  He has brisk PT Doppler signals bilaterally.  He denies any rest pain, claudication, or tissue loss He will continue his daily aspirin  and statin.  He can follow-up with our office in 1 year with repeat ABIs and aortoiliac duplex   Ahmed Holster PA-C [facility] Office: [phone]  Call MD: Magda

## 2023-08-11 ENCOUNTER — Ambulatory Visit: Admitting: Podiatry

## 2023-11-02 ENCOUNTER — Ambulatory Visit (INDEPENDENT_AMBULATORY_CARE_PROVIDER_SITE_OTHER): Admitting: Otolaryngology

## 2023-11-02 ENCOUNTER — Encounter (INDEPENDENT_AMBULATORY_CARE_PROVIDER_SITE_OTHER): Payer: Self-pay | Admitting: Otolaryngology

## 2023-11-02 VITALS — BP 134/70 | HR 60 | Temp 97.6°F | Ht 73.0 in | Wt 205.0 lb

## 2023-11-02 DIAGNOSIS — R42 Dizziness and giddiness: Secondary | ICD-10-CM

## 2023-11-02 DIAGNOSIS — H9042 Sensorineural hearing loss, unilateral, left ear, with unrestricted hearing on the contralateral side: Secondary | ICD-10-CM | POA: Diagnosis not present

## 2023-11-02 DIAGNOSIS — H9312 Tinnitus, left ear: Secondary | ICD-10-CM | POA: Diagnosis not present

## 2023-11-02 DIAGNOSIS — H8102 Meniere's disease, left ear: Secondary | ICD-10-CM

## 2023-11-02 NOTE — Progress Notes (Signed)
 Patient ID: Daniel Pierce, male   DOB: [birth year], 73 y.o.   MRN: [id number]  Follow-up: Recurrent dizziness, left ear Mnire's disease, left ear hearing loss  HPI: The patient is a 73 year old male who returns today for his follow-up evaluation.  The patient was previously seen for his recurrent dizziness and left ear hearing loss.  He was treated with low-salt diet, Dyazide  diuretic, and left endolymphatic sac decompression surgery.  The patient returns today reporting no significant vertigo over the past 6 months.  He continues to have hearing difficulty and buzzing tinnitus in the left ear.  However, he has not noted any recent change in his hearing level.  Currently he denies any otalgia, otorrhea, or vertigo.  Exam: General: Communicates without difficulty, well nourished, no acute distress. Head: Normocephalic, no evidence injury, no tenderness, facial buttresses intact without stepoff. Face/sinus: No tenderness to palpation and percussion. Facial movement is normal and symmetric. Eyes: PERRL, EOMI. No scleral icterus, conjunctivae clear. Neuro: CN II exam reveals vision grossly intact.  No nystagmus at any point of gaze. Ears: Auricles well formed without lesions.  Ear canals are intact without mass or lesion.  No erythema or edema is appreciated.  The TMs are intact without fluid. Nose: External evaluation reveals normal support and skin without lesions.  Dorsum is intact.  Anterior rhinoscopy reveals congested mucosa over anterior aspect of inferior turbinates and intact septum.  No purulence noted. Oral:  Oral cavity and oropharynx are intact, symmetric, without erythema or edema.  Mucosa is moist without lesions. Neck: Full range of motion without pain.  There is no significant lymphadenopathy.  No masses palpable.  Thyroid bed within normal limits to palpation.  Parotid glands and submandibular glands equal bilaterally without mass.  Trachea is midline. Neuro:  CN 2-12 grossly intact.    Assessment: 1.  The patient's recurrent dizziness and left ear Mnire's disease are currently under control with the low-salt diet and Dyazide  diuretic.  He previously underwent endolymphatic sac decompression surgery in 2023. 2.  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  Plan: 1.  The physical exam findings are reviewed with the patient.  The pathophysiology and clinical course of Mnire's disease are also discussed. 2.  Continue with his 1500 mg low-salt diet and Dyazide  diuretic. 3.  The patient is a candidate for hearing amplification. 4.  The patient will return for reevaluation in 6 months.

## 2024-01-04 ENCOUNTER — Other Ambulatory Visit: Payer: Self-pay

## 2024-01-04 MED ORDER — ATORVASTATIN CALCIUM 40 MG PO TABS: 40.0000 mg | ORAL_TABLET | Freq: Every day | ORAL | 3 refills | Status: AC

## 2024-01-28 ENCOUNTER — Ambulatory Visit: Admitting: Podiatry

## 2024-01-28 DIAGNOSIS — D2371 Other benign neoplasm of skin of right lower limb, including hip: Secondary | ICD-10-CM

## 2024-02-01 NOTE — Progress Notes (Signed)
 He presents today chief complaint of a painful lesion plantar aspect of his right foot.  Around the fourth metatarsal head.  Objective: Vital signs stable oriented x 3 broad callus painful neoplasm is noted to the plantar aspect fourth metatarsal head of the right foot.  He has a pad around it.  Once the pad was removed does not demonstrate any skin breakdown.  Assessment: This is painful benign skin lesion.  Plan: Debridement of the painful benign skin lesion placed padding we will follow-up with him on an as-needed basis.

## 2024-04-26 ENCOUNTER — Ambulatory Visit: Admitting: Podiatry

## 2024-05-03 ENCOUNTER — Ambulatory Visit (INDEPENDENT_AMBULATORY_CARE_PROVIDER_SITE_OTHER): Admitting: Otolaryngology
